# Patient Record
Sex: Male | Born: 1970 | Hispanic: No | Marital: Single | Smoking: Never smoker
Health system: Southern US, Community
[De-identification: ages and names within clinical notes are randomized; demographics above are authoritative.]

## PROBLEM LIST (undated history)

## (undated) DIAGNOSIS — M064 Inflammatory polyarthropathy: Secondary | ICD-10-CM

## (undated) DIAGNOSIS — M109 Gout, unspecified: Secondary | ICD-10-CM

## (undated) HISTORY — DX: Inflammatory polyarthropathy: M06.4

## (undated) HISTORY — DX: Gout, unspecified: M10.9

---

## 2007-05-05 ENCOUNTER — Ambulatory Visit: Payer: Self-pay | Admitting: Family Medicine

## 2007-05-06 ENCOUNTER — Ambulatory Visit: Payer: Self-pay | Admitting: *Deleted

## 2007-06-23 ENCOUNTER — Ambulatory Visit: Payer: Self-pay | Admitting: Family Medicine

## 2007-06-23 DIAGNOSIS — M109 Gout, unspecified: Secondary | ICD-10-CM

## 2007-08-04 ENCOUNTER — Ambulatory Visit: Payer: Self-pay | Admitting: Internal Medicine

## 2007-08-05 ENCOUNTER — Encounter (INDEPENDENT_AMBULATORY_CARE_PROVIDER_SITE_OTHER): Payer: Self-pay | Admitting: Internal Medicine

## 2007-08-05 LAB — CONVERTED CEMR LAB: Uric Acid, Serum: 10.1 mg/dL — ABNORMAL HIGH (ref 2.4–7.0)

## 2008-01-05 ENCOUNTER — Ambulatory Visit: Payer: Self-pay | Admitting: Internal Medicine

## 2008-01-05 LAB — CONVERTED CEMR LAB: VLDL: 34 mg/dL (ref 0–40)

## 2008-04-04 ENCOUNTER — Emergency Department (HOSPITAL_COMMUNITY): Admission: EM | Admit: 2008-04-04 | Discharge: 2008-04-04 | Payer: Self-pay | Admitting: Emergency Medicine

## 2009-02-08 ENCOUNTER — Ambulatory Visit: Payer: Self-pay | Admitting: Internal Medicine

## 2009-03-22 ENCOUNTER — Encounter (INDEPENDENT_AMBULATORY_CARE_PROVIDER_SITE_OTHER): Payer: Self-pay | Admitting: Adult Health

## 2009-03-22 ENCOUNTER — Ambulatory Visit: Payer: Self-pay | Admitting: Internal Medicine

## 2009-03-22 LAB — CONVERTED CEMR LAB
ALT: 15 units/L (ref 0–53)
AST: 19 units/L (ref 0–37)
Albumin: 4.7 g/dL (ref 3.5–5.2)
Alkaline Phosphatase: 100 units/L (ref 39–117)
Creatinine, Ser: 1.23 mg/dL (ref 0.40–1.50)
HDL: 38 mg/dL — ABNORMAL LOW (ref >39)
LDL Cholesterol: 111 mg/dL — ABNORMAL HIGH (ref 0–99)
Potassium: 3.8 meq/L (ref 3.5–5.3)
Total Protein: 7.7 g/dL (ref 6.0–8.3)
Uric Acid, Serum: 10.6 mg/dL — ABNORMAL HIGH (ref 4.0–7.8)

## 2009-04-07 ENCOUNTER — Ambulatory Visit: Payer: Self-pay | Admitting: Adult Health

## 2009-04-07 LAB — CONVERTED CEMR LAB
Basophils Absolute: 0.1 10*3/uL (ref 0.0–0.1)
Basophils Relative: 1 % (ref 0–1)
Eosinophils Absolute: 0.1 10*3/uL (ref 0.0–0.7)
Eosinophils Relative: 1 % (ref 0–5)
Hemoglobin: 14.3 g/dL (ref 13.0–17.0)
Lymphocytes Relative: 34 % (ref 12–46)
MCV: 91.1 fL (ref 78.0–100.0)
Neutro Abs: 2.9 10*3/uL (ref 1.7–7.7)
Neutrophils Relative %: 57 % (ref 43–77)
RBC: 4.7 M/uL (ref 4.22–5.81)

## 2010-02-19 ENCOUNTER — Ambulatory Visit: Payer: Self-pay | Admitting: Internal Medicine

## 2010-03-05 ENCOUNTER — Ambulatory Visit: Payer: Self-pay | Admitting: Internal Medicine

## 2010-04-03 ENCOUNTER — Ambulatory Visit: Payer: Self-pay | Admitting: Internal Medicine

## 2010-04-03 ENCOUNTER — Encounter (INDEPENDENT_AMBULATORY_CARE_PROVIDER_SITE_OTHER): Payer: Self-pay | Admitting: Adult Health

## 2010-04-03 LAB — CONVERTED CEMR LAB
CO2: 26 meq/L (ref 19–32)
Chloride: 102 meq/L (ref 96–112)
Cholesterol: 153 mg/dL (ref 0–200)
Creatinine, Ser: 1.22 mg/dL (ref 0.40–1.50)
LDL Cholesterol: 84 mg/dL (ref 0–99)
Potassium: 4.4 meq/L (ref 3.5–5.3)
Sodium: 139 meq/L (ref 135–145)
Total CHOL/HDL Ratio: 3.3
Total Protein: 7.6 g/dL (ref 6.0–8.3)

## 2010-05-16 ENCOUNTER — Encounter (INDEPENDENT_AMBULATORY_CARE_PROVIDER_SITE_OTHER): Payer: Self-pay | Admitting: Adult Health

## 2010-05-16 ENCOUNTER — Ambulatory Visit: Payer: Self-pay | Admitting: Internal Medicine

## 2010-05-16 LAB — CONVERTED CEMR LAB
Anti Nuclear Antibody(ANA): NEGATIVE
Basophils Absolute: 0.1 10*3/uL (ref 0.0–0.1)
CRP: 0.3 mg/dL (ref <0.6)
Eosinophils Absolute: 0.1 10*3/uL (ref 0.0–0.7)
Monocytes Absolute: 0.3 10*3/uL (ref 0.1–1.0)
Monocytes Relative: 6 % (ref 3–12)
Platelets: 282 10*3/uL (ref 150–400)
RBC: 5.28 M/uL (ref 4.22–5.81)
RDW: 13 % (ref 11.5–15.5)
Rhuematoid fact SerPl-aCnc: <20 intl units/mL (ref 0–20)
Sed Rate: 21 mm/hr — ABNORMAL HIGH (ref 0–16)
WBC: 4.9 10*3/uL (ref 4.0–10.5)

## 2010-05-17 ENCOUNTER — Ambulatory Visit (HOSPITAL_COMMUNITY): Admission: RE | Admit: 2010-05-17 | Discharge: 2010-05-17 | Payer: Self-pay | Admitting: Internal Medicine

## 2010-05-30 ENCOUNTER — Ambulatory Visit: Payer: Self-pay | Admitting: Internal Medicine

## 2010-11-10 ENCOUNTER — Encounter (INDEPENDENT_AMBULATORY_CARE_PROVIDER_SITE_OTHER): Payer: Self-pay | Admitting: *Deleted

## 2011-12-12 DIAGNOSIS — M064 Inflammatory polyarthropathy: Secondary | ICD-10-CM | POA: Insufficient documentation

## 2011-12-14 IMAGING — CR DG HAND COMPLETE 3+V*L*
3 series · 3 of 3 positions shown · non-contrast
Comparison: None.

CLINICAL DATA: 39-year-old male with bilateral hand pain.  No known
injury.  Query gout, rheumatoid arthritis.

LEFT HAND - COMPLETE 3+ VIEW,
RIGHT HAND - COMPLETE 3+ VIEW

[x hand pa left]
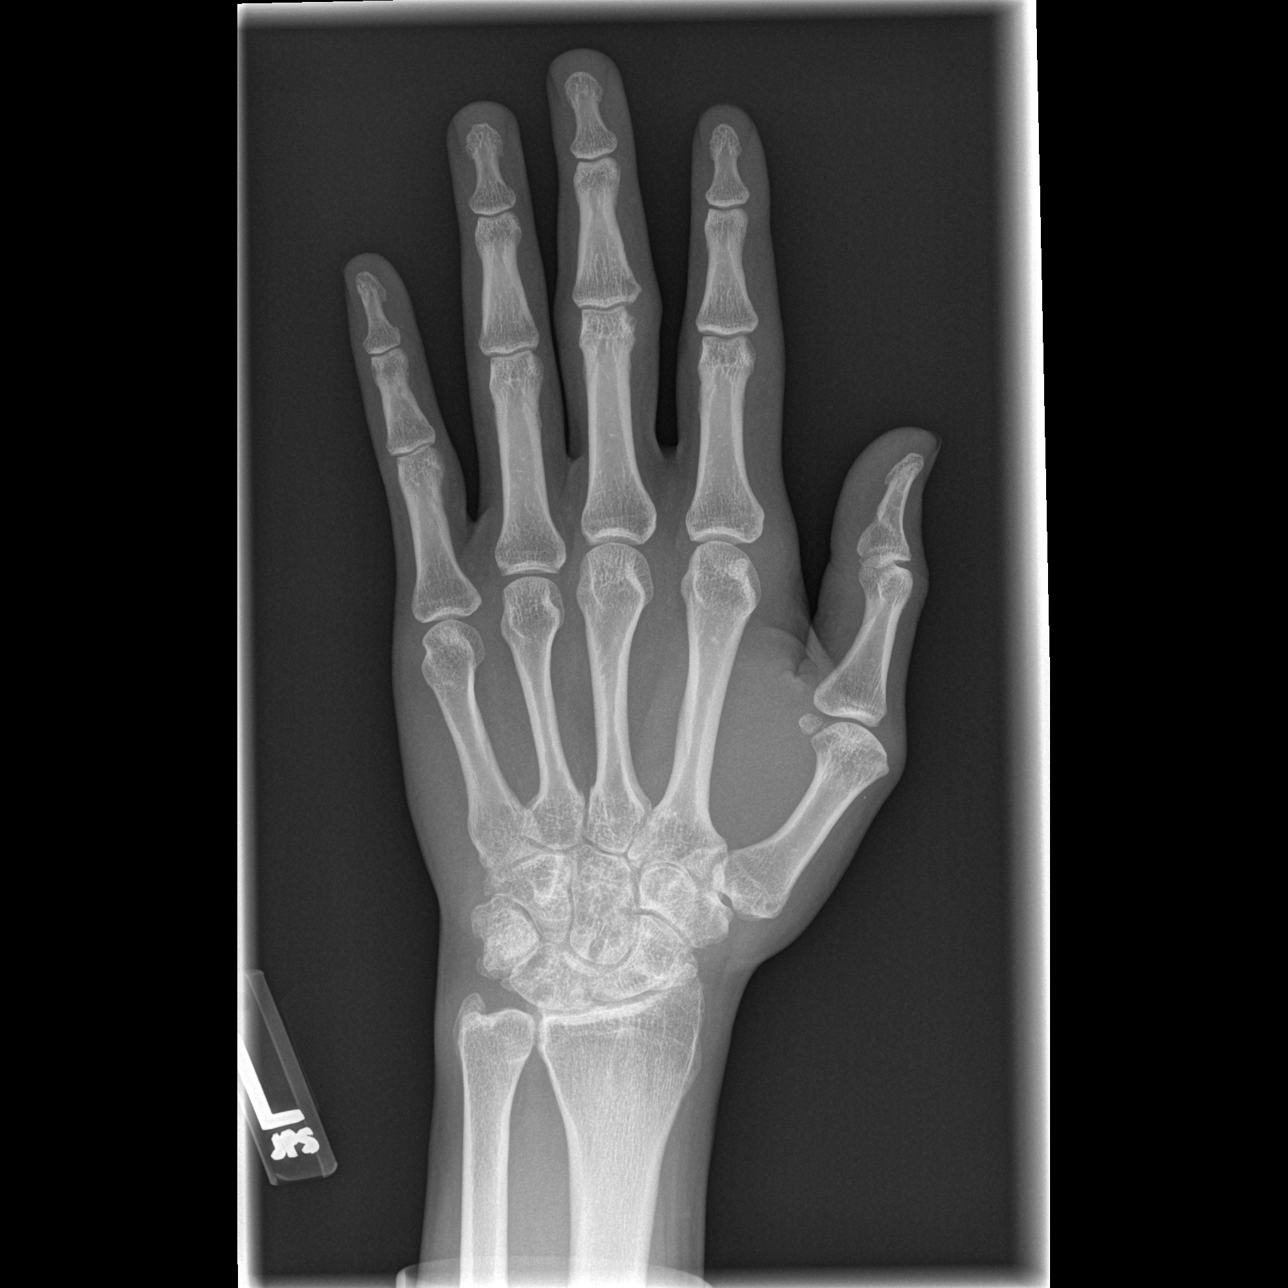

[x hand oblique left]
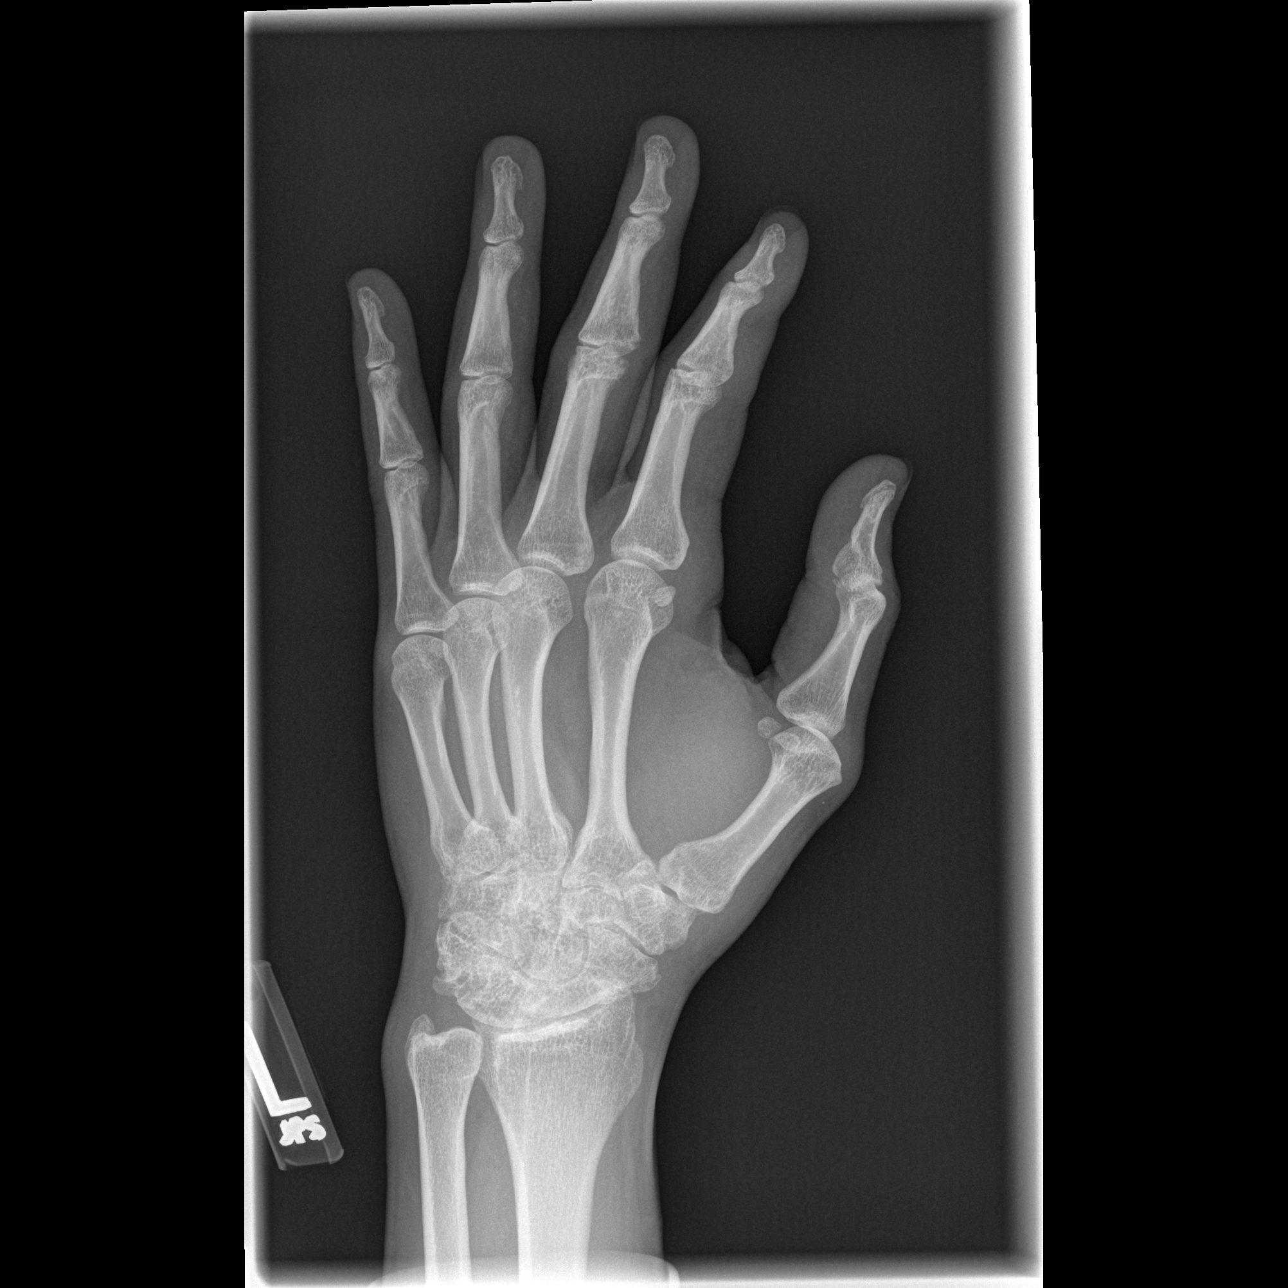

[x hand lat left]
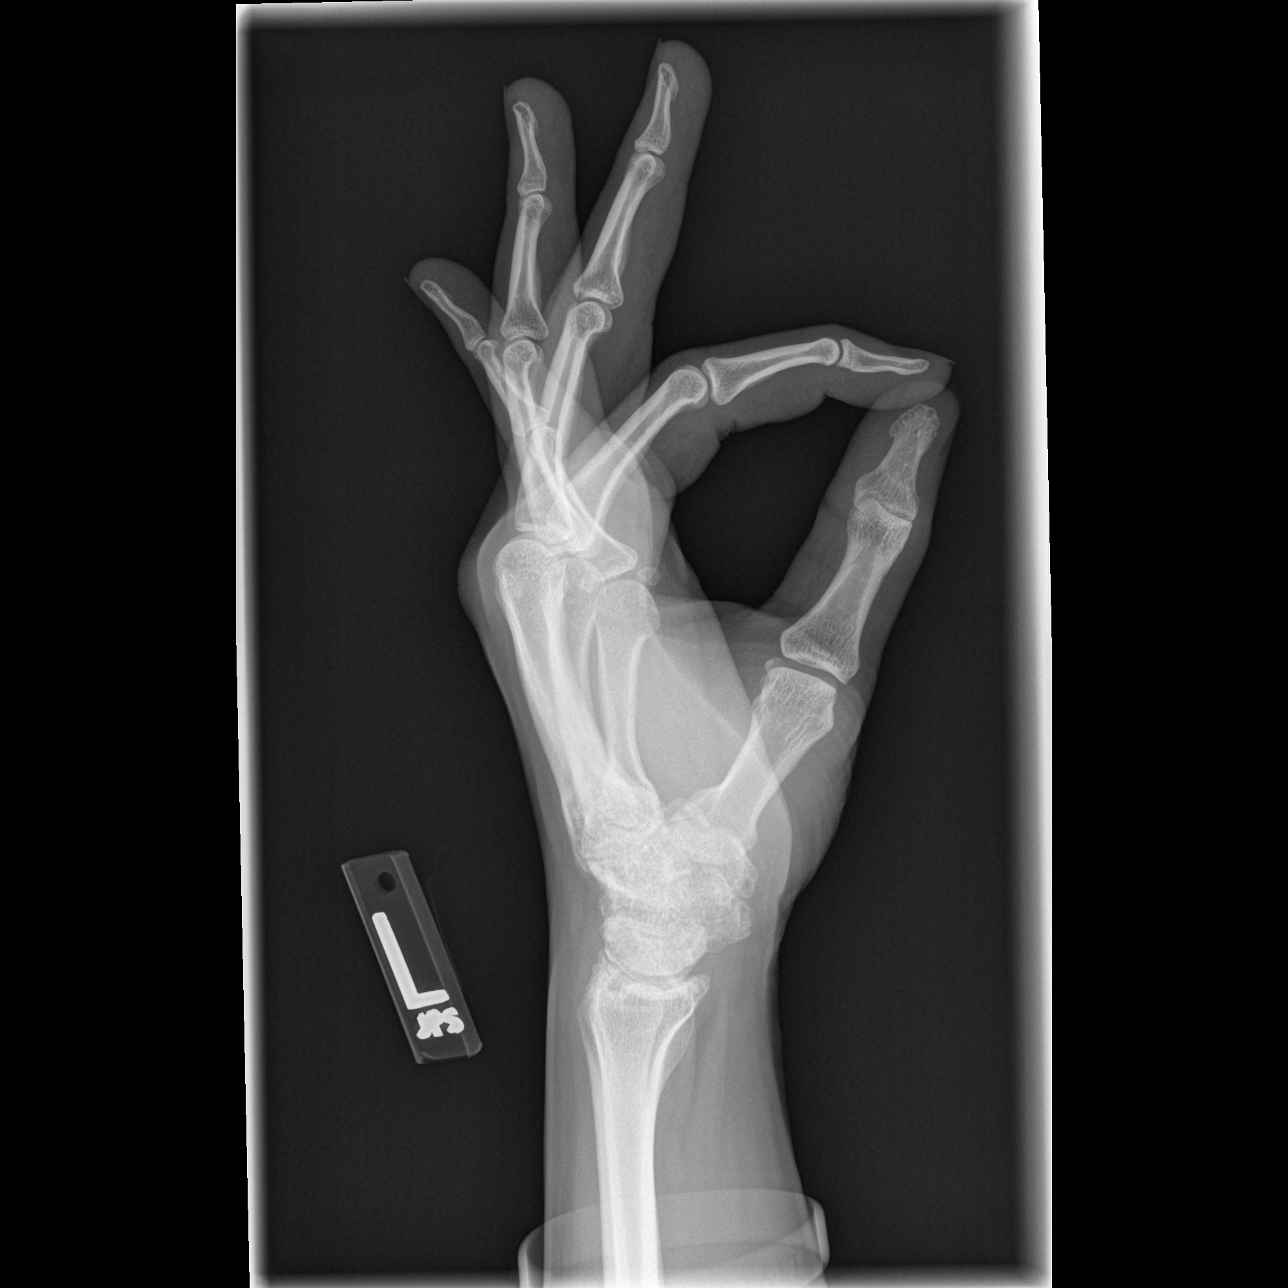

[3 of 3 positions shown; findings below may reference images not displayed]

FINDINGS: Left hand: Bone mineralization is within normal limits. There is
carpal joint space narrowing with subchondral sclerosis.  No
periarticular erosions are identified but there is subchondral
lucency.  There is subtle irregularity at the radial aspect of the
left third proximal interphalangeal joint, with some subarticular
lucency similar the carpal bones.  Other joint spaces appear
normal.  No acute fracture or dislocation.

Right hand: Bone mineralization is within normal limits.
Periarticular/subchondral lucency and irregularity at the second
and third metatarsal phalangeal joint, joint spaces appear
relatively preserved.  No periosteal new bone.  Suggestion of
carpal joint space loss, but not as pronounced as on the opposite
side.  Outside these areas, joint spaces are within normal limits.
No acute fracture.
IMPRESSION: Abnormality involving the carpal bones left greater than right, the
right second and third MCP joints, and the left third PIP joint.
Constellation of findings favors pseudo gout (calcium pyrophosphate
deposition disease.). Clinical correlation recommended.

## 2019-01-22 ENCOUNTER — Encounter: Payer: Self-pay | Admitting: Nurse Practitioner

## 2019-01-22 ENCOUNTER — Ambulatory Visit: Payer: Self-pay | Attending: Nurse Practitioner | Admitting: Nurse Practitioner

## 2019-01-22 VITALS — BP 121/88 | HR 106 | Ht 66.0 in | Wt 169.6 lb

## 2019-01-22 DIAGNOSIS — M1A49X0 Other secondary chronic gout, multiple sites, without tophus (tophi): Secondary | ICD-10-CM

## 2019-01-22 DIAGNOSIS — M064 Inflammatory polyarthropathy: Secondary | ICD-10-CM

## 2019-01-22 MED ORDER — CYCLOBENZAPRINE HCL 5 MG PO TABS: 5.0000 mg | ORAL_TABLET | Freq: Three times a day (TID) | ORAL | 1 refills | Status: AC | PRN

## 2019-01-22 MED ORDER — ALLOPURINOL 100 MG PO TABS: 100.0000 mg | ORAL_TABLET | Freq: Every day | ORAL | 6 refills | Status: DC

## 2019-01-22 MED ORDER — PREDNISONE 20 MG PO TABS: ORAL_TABLET | ORAL | 0 refills | Status: DC

## 2019-01-22 MED FILL — ALLOPURINOL 100 MG TABLET: 100 | 30 days supply | Qty: 30 | Fill #0

## 2019-01-22 MED FILL — CYCLOBENZAPRINE 5 MG TABLET: 5 | 20 days supply | Qty: 60 | Fill #0

## 2019-01-22 MED FILL — predniSONE 20 MG TABS: 20 | 6 days supply | Qty: 7 | Fill #0

## 2019-01-22 NOTE — Progress Notes (Signed)
Assessment & Plan:  Terre was seen today for back pain and leg pain.  Diagnoses and all orders for this visit:  Other secondary chronic gout of multiple sites without tophus -     Uric Acid -     allopurinol (ZYLOPRIM) 100 MG tablet; Take 1 tablet (100 mg total) by mouth daily.  Inflammatory polyarthropathy (HCC) -     CBC -     CMP14+EGFR -     cyclobenzaprine (FLEXERIL) 5 MG tablet; Take 1 tablet (5 mg total) by mouth 3 (three) times daily as needed for up to 30 days for muscle spasms. -     predniSONE (DELTASONE) 20 MG tablet; Take 2 tablets by mouth once a day on days 1-2; take 1 tablet on days 3-4; take 1/2 tablet on days 5-6 then stop   Patient has been advised to apply for financial assistance and schedule to see our financial counselor.     Patient has been counseled on age-appropriate routine health concerns for screening and prevention. These are reviewed and up-to-date. Referrals have been placed accordingly. Immunizations are up-to-date or declined.    Subjective:   Chief Complaint  Patient presents with  . Back Pain  . Leg Pain   HPI Nathan Rubio 48 y.o. male presents to office today to establish care. He has a history of gout with inflammatory polyarthropathy. He has not seen a rheumatologist since 2013. He has been taking a colchicine from [place] as needed. He will need to be referred to a rheumatologist due to his advance disease. He has been on as much as 700 mg of allopurinol per review of office notes from 07-23-2012. Previous medication regimen:  Allopurinol 540m daily, colchicine once daily and chronic prednisone 560mdaily.   Arthritis: Symptoms have been present for several years. Onset was gradual. Symptoms include joint pain, joint swelling, morning stiffness, skin nodules and sleep difficulties and are of moderate and severe severity. Patient denies eye symptoms and weakness of hands. Symptoms are made worse by: nothing.  Symptoms are helped by  arthritis medications.  Associated symptoms include none. Patient denies associated nausea and oral ulcers.  Overall disease activity:  worse. Limitation on activities include difficulty with walking, difficulty with getting dressed and difficulty with ADLs.    Back Pain Chronic. He endorses lower left sided lumbar pain radiating to the left side of abdomen. He has been seeing a chiropractor over the past few weeks for adjustments of his spine which he reports has provided some relief of his pain. Pain is described as sharp, aching, and tight. Aggravating factors: sitting, standing, twisting, turning, lying on his left side. Relieving factors: NONE.    Review of Systems  Constitutional: Negative for fever, malaise/fatigue and weight loss.  HENT: Negative.  Negative for nosebleeds.   Eyes: Negative.  Negative for blurred vision, double vision and photophobia.  Respiratory: Negative.  Negative for cough and shortness of breath.   Cardiovascular: Negative.  Negative for chest pain, palpitations and leg swelling.  Gastrointestinal: Negative.  Negative for heartburn, nausea and vomiting.  Musculoskeletal: Positive for back pain and joint pain. Negative for myalgias.       SEE HPI  Neurological: Negative.  Negative for dizziness, focal weakness, seizures and headaches.  Psychiatric/Behavioral: Negative.  Negative for suicidal ideas.    Past Medical History:  Diagnosis Date  . Gout   . Inflammatory polyarthritis ([facility]    History reviewed. No pertinent surgical history.  Family History  Problem  Relation Age of Onset  . Depression Neg Hx   . Diabetes Neg Hx     Social History Reviewed with no changes to be made today.   No outpatient medications prior to visit.   No facility-administered medications prior to visit.     No Known Allergies     Objective:    BP 121/88   Pulse (!) 106   Ht _0  (1.676 m)   Wt 169 lb 9.6 oz (76.9 kg)   SpO2 99%   BMI 27.37 kg/m  Wt Readings  from Last 3 Encounters:  01/22/19 169 lb 9.6 oz (76.9 kg)    Physical Exam Vitals signs and nursing note reviewed.  Constitutional:      Appearance: He is well-developed.  HENT:     Head: Normocephalic and atraumatic.  Neck:     Musculoskeletal: Normal range of motion.  Cardiovascular:     Rate and Rhythm: Normal rate and regular rhythm.     Heart sounds: Normal heart sounds. No murmur. No friction rub. No gallop.   Pulmonary:     Effort: Pulmonary effort is normal. No tachypnea or respiratory distress.     Breath sounds: Normal breath sounds. No decreased breath sounds, wheezing, rhonchi or rales.  Chest:     Chest wall: No tenderness.  Abdominal:     General: Bowel sounds are normal.     Palpations: Abdomen is soft.  Musculoskeletal:     Lumbar back: He exhibits tenderness and pain. He exhibits no bony tenderness, no swelling and no edema.       Arms:     Comments: Significant nodular deformities of bilateral DIPs  Skin:    General: Skin is warm and dry.  Neurological:     Mental Status: He is alert and oriented to person, place, and time.     Coordination: Coordination normal.  Psychiatric:        Behavior: Behavior normal. Behavior is cooperative.        Thought Content: Thought content normal.        Judgment: Judgment normal.          Patient has been counseled extensively about nutrition and exercise as well as the importance of adherence with medications and regular follow-up. The patient was given clear instructions to go to ER or return to medical center if symptoms don't improve, worsen or new problems develop. The patient verbalized understanding.   Follow-up: Return in about 4 weeks (around 02/19/2019) for polyarthralgia; , Needs appointment with financial representative.Gildardo Pounds, FNP-BC [facility] [place], [place]   01/23/2019, 12:04 AM

## 2019-01-22 NOTE — Patient Instructions (Signed)
Joint Pain Joint pain (arthralgia) may be caused by many things. Joint pain is likely to go away when you follow instructions from your health care provider for relieving pain at home. However, joint pain can also be caused by conditions that require more treatment. Common causes of joint pain include:  Bruising in the area of the joint.  Injury caused by repeating certain movements too many times (overuse injury).  Age-related joint wear and tear (osteoarthritis).  Buildup of uric acid crystals in the joint (gout).  Inflammation of the joint (rheumatic disease).  Various other forms of arthritis.  Infections of the joint (septic arthritis) or of the bone (osteomyelitis). Your health care provider may recommend that you take pain medicine or wear a supportive device like an elastic bandage, sling, or splint. If your joint pain continues, you may need lab or imaging tests to diagnose the cause of your joint pain. Follow these instructions at home: Managing pain, stiffness, and swelling   If directed, put ice on the painful area. Icing can help to relieve joint pain and swelling. ? Put ice in a plastic bag. ? Place a towel between your skin and the bag. ? Leave the ice on for 20 minutes, 2-3 times a day.  If directed, apply heat to the painful area as often as told by your health care provider. Heat can reduce the stiffness of your muscles and joints. Use the heat source that your health care provider recommends, such as a moist heat pack or a heating pad. ? Place a towel between your skin and the heat source. ? Leave the heat on for 20-30 minutes. ? Remove the heat if your skin turns bright red. This is especially important if you are unable to feel pain, heat, or cold. You may have a greater risk of getting burned.  Move your fingers or toes below the painful joint often. You can avoid stiffness and lessen swelling by doing this.  If possible, raise (elevate) the painful joint above  the level of your heart while you are sitting or lying down. To do this, try putting a few pillows under the painful joint. Activity  Rest the painful joint for as long as directed. Do not do anything that causes or worsens pain.  Begin exercising or stretching the affected area, as told by your health care provider. Ask your health care provider what types of exercise are safe for you. If you have an elastic bandage, sling, or splint:  Wear the supportive device as told by your health care provider. Remove it only as told by your health care provider.  Loosen the device if your fingers or toes below the joint tingle, become numb, or turn cold and blue.  Keep the device clean.  Ask your health care provider if you should remove the device before bathing. You may need to cover it with a watertight covering when you take a bath or a shower. General instructions  Take over-the-counter and prescription medicines only as told by your health care provider.  Do not use any products that contain nicotine or tobacco, such as cigarettes and e-cigarettes. If you need help quitting, ask your health care provider.  Keep all follow-up visits as told by your health care provider. This is important. Contact a health care provider if:  You have pain that gets worse and does not get better with medicine.  Your joint pain does not improve within 3 days.  You have increased bruising or swelling.  You have a fever.  You lose 10 lb (4.5 kg) or more without trying. Get help right away if:  You cannot move the joint.  Your fingers or toes tingle, become numb, or turn cold and blue.  You have a fever along with a joint that is red, warm, and swollen. Summary  Joint pain (arthralgia) may be caused by many things.  Your health care provider may recommend that you take pain medicine or wear a supportive device like an elastic bandage, sling, or splint.  If your joint pain continues, you may need  tests to diagnose the cause of your joint pain.  Take over-the-counter and prescription medicines only as told by your health care provider. This information is not intended to replace advice given to you by your health care provider. Make sure you discuss any questions you have with your health care provider. Document Released: 12/09/2005 Document Revised: 09/24/2017 Document Reviewed: 09/24/2017 Elsevier Interactive Patient Education  2019 Else[employer] Inc.  Gout  Gout is painful swelling of your joints. Gout is a type of arthritis. It is caused by having too much uric acid in your body. Uric acid is a chemical that is made when your body breaks down substances called purines. If your body has too much uric acid, sharp crystals can form and build up in your joints. This causes pain and swelling. Gout attacks can happen quickly and be very painful (acute gout). Over time, the attacks can affect more joints and happen more often (chronic gout). What are the causes?  Too much uric acid in your blood. This can happen because: ? Your kidneys do not remove enough uric acid from your blood. ? Your body makes too much uric acid. ? You eat too many foods that are high in purines. These foods include organ meats, some seafood, and beer.  Trauma or stress. What increases the risk?  Having a family history of gout.  Being male and middle-aged.  Being male and having gone through menopause.  Being very overweight (obese).  Drinking alcohol, especially beer.  Not having enough water in the body (being dehydrated).  Losing weight too quickly.  Having an organ transplant.  Having lead poisoning.  Taking certain medicines.  Having kidney disease.  Having a skin condition called psoriasis. What are the signs or symptoms? An attack of acute gout usually happens in just one joint. The most common place is the big toe. Attacks often start at night. Other joints that may be affected include  joints of the feet, ankle, knee, fingers, wrist, or elbow. Symptoms of an attack may include:  Very bad pain.  Warmth.  Swelling.  Stiffness.  Shiny, red, or purple skin.  Tenderness. The affected joint may be very painful to touch.  Chills and fever. Chronic gout may cause symptoms more often. More joints may be involved. You may also have white or yellow lumps (tophi) on your hands or feet or in other areas near your joints. How is this treated?  Treatment for this condition has two phases: treating an acute attack and preventing future attacks.  Acute gout treatment may include: ? NSAIDs. ? Steroids. These are taken by mouth or injected into a joint. ? Colchicine. This medicine relieves pain and swelling. It can be given by mouth or through an IV tube.  Preventive treatment may include: ? Taking small doses of NSAIDs or colchicine daily. ? Using a medicine that reduces uric acid levels in your blood. ? Making changes to  your diet. You may need to see a food expert (dietitian) about what to eat and drink to prevent gout. Follow these instructions at home: During a gout attack   If told, put ice on the painful area: ? Put ice in a plastic bag. ? Place a towel between your skin and the bag. ? Leave the ice on for 20 minutes, 2-3 times a day.  Raise (elevate) the painful joint above the level of your heart as often as you can.  Rest the joint as much as possible. If the joint is in your leg, you may be given crutches.  Follow instructions from your doctor about what you cannot eat or drink. Avoiding future gout attacks  Eat a low-purine diet. Avoid foods and drinks such as: ? Liver. ? Kidney. ? Anchovies. ? Asparagus. ? Herring. ? Mushrooms. ? Mussels. ? Beer.  Stay at a healthy weight. If you want to lose weight, talk with your doctor. Do not lose weight too fast.  Start or continue an exercise plan as told by your doctor. Eating and drinking  Drink enough  fluids to keep your pee (urine) pale yellow.  If you drink alcohol: ? Limit how much you use to:  0-1 drink a day for women.  0-2 drinks a day for men. ? Be aware of how much alcohol is in your drink. In the [place] one drink equals one 12 oz bottle of beer (355 mL), one 5 oz glass of wine (148 mL), or one 1 oz glass of hard liquor (44 mL). General instructions  Take over-the-counter and prescription medicines only as told by your doctor.  Do not drive or use heavy machinery while taking prescription pain medicine.  Return to your normal activities as told by your doctor. Ask your doctor what activities are safe for you.  Keep all follow-up visits as told by your doctor. This is important. Contact a doctor if:  You have another gout attack.  You still have symptoms of a gout attack after 10 days of treatment.  You have problems (side effects) because of your medicines.  You have chills or a fever.  You have burning pain when you pee (urinate).  You have pain in your lower back or belly. Get help right away if:  You have very bad pain.  Your pain cannot be controlled.  You cannot pee. Summary  Gout is painful swelling of the joints.  The most common site of pain is the big toe, but it can affect other joints.  Medicines and avoiding some foods can help to prevent and treat gout attacks. This information is not intended to replace advice given to you by your health care provider. Make sure you discuss any questions you have with your health care provider. Document Released: 09/17/2008 Document Revised: 07/01/2018 Document Reviewed: 07/01/2018 Elsevier Interactive Patient Education  2019 Elsevier Inc.  Low-Purine Eating Plan A low-purine eating plan involves making food choices to limit your intake of purine. Purine is a kind of uric acid. Too much uric acid in your blood can cause certain conditions, such as gout and kidney stones. Eating a low-purine diet can help  control these conditions. What are tips for following this plan? Reading food labels   Avoid foods with saturated or Trans fat.  Check the ingredient list of grains-based foods, such as bread and cereal, to make sure that they contain whole grains.  Check the ingredient list of sauces or soups to make sure they do  not contain meat or fish.  When choosing soft drinks, check the ingredient list to make sure they do not contain high-fructose corn syrup. Shopping  Buy plenty of fresh fruits and vegetables.  Avoid buying canned or fresh fish.  Buy dairy products labeled as low-fat or nonfat.  Avoid buying premade or processed foods. These foods are often high in fat, salt (sodium), and added sugar. Cooking  Use olive oil instead of butter when cooking. Oils like olive oil, canola oil, and sunflower oil contain healthy fats. Meal planning  Learn which foods do or do not affect you. If you find out that a food tends to cause your gout symptoms to flare up, avoid eating that food. You can enjoy foods that do not cause problems. If you have any questions about a food item, talk with your dietitian or health care provider.  Limit foods high in fat, especially saturated fat. Fat makes it harder for your body to get rid of uric acid.  Choose foods that are lower in fat and are lean sources of protein. General guidelines  Limit alcohol intake to no more than 1 drink a day for nonpregnant women and 2 drinks a day for men. One drink equals 12 oz of beer, 5 oz of wine, or 1 oz of hard liquor. Alcohol can affect the way your body gets rid of uric acid.  Drink plenty of water to keep your urine clear or pale yellow. Fluids can help remove uric acid from your body.  If directed by your health care provider, take a vitamin C supplement.  Work with your health care provider and dietitian to develop a plan to achieve or maintain a healthy weight. Losing weight can help reduce uric acid in your  blood. What foods are recommended? The items listed may not be a complete list. Talk with your dietitian about what dietary choices are best for you. Foods low in purines Foods low in purines do not need to be limited. These include:  All fruits.  All low-purine vegetables, pickles, and olives.  Breads, pasta, rice, cornbread, and popcorn. Cake and other baked goods.  All dairy foods.  Eggs, nuts, and nut butters.  Spices and condiments, such as salt, herbs, and vinegar.  Plant oils, butter, and margarine.  Water, sugar-free soft drinks, tea, coffee, and cocoa.  Vegetable-based soups, broths, sauces, and gravies. Foods moderate in purines Foods moderate in purines should be limited to the amounts listed.   cup of asparagus, cauliflower, spinach, mushrooms, or green peas, each day.  2/3 cup uncooked oatmeal, each day.   cup dry wheat bran or wheat germ, each day.  2-3 ounces of meat or poultry, each day.  4-6 ounces of shellfish, such as crab, lobster, oysters, or shrimp, each day.  1 cup cooked beans, peas, or lentils, each day.  Soup, broths, or bouillon made from meat or fish. Limit these foods as much as possible. What foods are not recommended? The items listed may not be a complete list. Talk with your dietitian about what dietary choices are best for you. Limit your intake of foods high in purines, including:  Beer and other alcohol.  Meat-based gravy or sauce.  Canned or fresh fish, such as: ? Anchovies, sardines, herring, and tuna. ? Mussels and scallops. ? Codfish, trout, and haddock.  Tomasa BlaseBacon.  Organ meats, such as: ? Liver or kidney. ? Tripe. ? Sweetbreads (thymus gland or pancreas).  Wild Education officer, environmentalgame or goose.  Yeast or yeast extract  supplements.  Drinks sweetened with high-fructose corn syrup. Summary  Eating a low-purine diet can help control conditions caused by too much uric acid in the body, such as gout or kidney stones.  Choose  low-purine foods, limit alcohol, and limit foods high in fat.  You will learn over time which foods do or do not affect you. If you find out that a food tends to cause your gout symptoms to flare up, avoid eating that food. This information is not intended to replace advice given to you by your health care provider. Make sure you discuss any questions you have with your health care provider. Document Released: 04/05/2011 Document Revised: 01/22/2017 Document Reviewed: 01/22/2017 Elsevier Interactive Patient Education  2019 [employer].

## 2019-01-23 ENCOUNTER — Encounter: Payer: Self-pay | Admitting: Nurse Practitioner

## 2019-01-23 LAB — CBC
HEMATOCRIT: 44.6 % (ref 37.5–51.0)
HEMOGLOBIN: 14.7 g/dL (ref 13.0–17.7)
MCH: 28.2 pg (ref 26.6–33.0)
MCHC: 33 g/dL (ref 31.5–35.7)
MCV: 86 fL (ref 79–97)
Platelets: 395 10*3/uL (ref 150–450)
RBC: 5.21 x10E6/uL (ref 4.14–5.80)
RDW: 13 % (ref 11.6–15.4)
WBC: 9.8 10*3/uL (ref 3.4–10.8)

## 2019-01-23 LAB — CMP14+EGFR
A/G RATIO: 1.4 (ref 1.2–2.2)
ALT: 13 IU/L (ref 0–44)
AST: 19 IU/L (ref 0–40)
Albumin: 4.6 g/dL (ref 4.0–5.0)
Alkaline Phosphatase: 83 IU/L (ref 39–117)
BILIRUBIN TOTAL: 0.3 mg/dL (ref 0.0–1.2)
BUN/Creatinine Ratio: 11 (ref 9–20)
BUN: 18 mg/dL (ref 6–24)
CO2: 24 mmol/L (ref 20–29)
Calcium: 9.8 mg/dL (ref 8.7–10.2)
Chloride: 97 mmol/L (ref 96–106)
Creatinine, Ser: 1.66 mg/dL — ABNORMAL HIGH (ref 0.76–1.27)
GFR, EST AFRICAN AMERICAN: 56 mL/min/{1.73_m2} — AB (ref >59)
GFR, EST NON AFRICAN AMERICAN: 48 mL/min/{1.73_m2} — AB (ref >59)
GLOBULIN, TOTAL: 3.4 g/dL (ref 1.5–4.5)
Glucose: 107 mg/dL — ABNORMAL HIGH (ref 65–99)
POTASSIUM: 4.1 mmol/L (ref 3.5–5.2)
SODIUM: 140 mmol/L (ref 134–144)
TOTAL PROTEIN: 8 g/dL (ref 6.0–8.5)

## 2019-01-23 LAB — URIC ACID: URIC ACID: 10.1 mg/dL — AB (ref 3.7–8.6)

## 2019-01-25 ENCOUNTER — Other Ambulatory Visit: Payer: Self-pay | Admitting: Nurse Practitioner

## 2019-01-25 MED ORDER — COLCHICINE 0.6 MG PO TABS: 0.6000 mg | ORAL_TABLET | Freq: Two times a day (BID) | ORAL | 3 refills | Status: DC

## 2019-01-26 ENCOUNTER — Telehealth: Payer: Self-pay

## 2019-01-26 MED FILL — !COLCRYS 0.6 MG TABLET: 0.6 MG | 15 days supply | Qty: 30 | Fill #0

## 2019-01-26 NOTE — Telephone Encounter (Signed)
Patient was inform on lab results and Rx.  Patient verified DOB and understood.

## 2019-01-26 NOTE — Telephone Encounter (Signed)
-----   Message from Claiborne Rigg, NP sent at 01/25/2019 10:01 PM EST ----- Uric acid level is 10.1. Will need to recheck at your next office visit. Will send in colchicine for you to take once your prednisone is complete.

## 2019-01-26 NOTE — Telephone Encounter (Signed)
CMA attempt to call patient to inform on lab results. Both home and cell number have no voicemail that has been set up.   A letter will be send out to reach patient.

## 2019-02-17 MED FILL — ALLOPURINOL 100 MG TABLET: 100 | 30 days supply | Qty: 30 | Fill #1

## 2019-02-17 MED FILL — !COLCRYS 0.6 MG TABLET: 0.6 MG | 15 days supply | Qty: 30 | Fill #1

## 2019-02-24 ENCOUNTER — Ambulatory Visit: Payer: Self-pay | Attending: Nurse Practitioner | Admitting: Nurse Practitioner

## 2019-02-24 ENCOUNTER — Encounter: Payer: Self-pay | Admitting: Nurse Practitioner

## 2019-02-24 ENCOUNTER — Ambulatory Visit: Payer: Self-pay | Admitting: Nurse Practitioner

## 2019-02-24 VITALS — BP 143/103 | HR 80 | Temp 98.0°F | Ht 66.0 in | Wt 169.2 lb

## 2019-02-24 DIAGNOSIS — M1A09X0 Idiopathic chronic gout, multiple sites, without tophus (tophi): Secondary | ICD-10-CM

## 2019-02-24 DIAGNOSIS — R6882 Decreased libido: Secondary | ICD-10-CM

## 2019-02-24 DIAGNOSIS — R030 Elevated blood-pressure reading, without diagnosis of hypertension: Secondary | ICD-10-CM

## 2019-02-24 NOTE — Patient Instructions (Addendum)
Inc.  Gout  Gout is a condition that causes painful swelling of the joints. Gout is a type of inflammation of the joints (arthritis). This condition is caused by having too much uric acid in the body. Uric acid is a chemical that forms when the body breaks down substances called purines. Purines are important for building body proteins. When the body has too much uric acid, sharp crystals can form and build up inside the joints. This causes pain and swelling. Gout attacks can happen quickly and may be very painful (acute gout). Over time, the attacks can affect more joints and become more frequent (chronic gout). Gout can also cause uric acid to build up under the skin and inside the kidneys. What are the causes? This condition is caused by too much uric acid in your blood. This can happen because:  Your kidneys do not remove enough uric acid from your blood. This is the most common cause.  Your body makes too much uric acid. This can happen with some cancers and cancer treatments. It can also occur if your body is breaking down too many red blood cells (hemolytic anemia).  You eat too many foods that are high in purines. These foods include organ meats and some seafood. Alcohol, especially beer, is also high in purines. A gout attack may be triggered by trauma or stress. What increases the risk? You are more likely to develop this condition if you:  Have a family history of gout.  Are male and middle-aged.  Are male and have gone through menopause.  Are obese.  Frequently drink alcohol, especially beer.  Are dehydrated.  Lose weight too quickly.  Have an organ transplant.  Have lead poisoning.  Take certain medicines, including aspirin, cyclosporine, diuretics, levodopa, and niacin.  Have kidney disease.  Have a skin condition called psoriasis. What are the signs or symptoms? An attack of acute gout happens quickly. It usually occurs in just one joint. The most common  place is the big toe. Attacks often start at night. Other joints that may be affected include joints of the feet, ankle, knee, fingers, wrist, or elbow. Symptoms of this condition may include:  Severe pain.  Warmth.  Swelling.  Stiffness.  Tenderness. The affected joint may be very painful to touch.  Shiny, red, or purple skin.  Chills and fever. Chronic gout may cause symptoms more frequently. More joints may be involved. You may also have white or yellow lumps (tophi) on your hands or feet or in other areas near your joints. How is this diagnosed? This condition is diagnosed based on your symptoms, medical history, and physical exam. You may have tests, such as:  Blood tests to measure uric acid levels.  Removal of joint fluid with a thin needle (aspiration) to look for uric acid crystals.  X-rays to look for joint damage. How is this treated? Treatment for this condition has two phases: treating an acute attack and preventing future attacks. Acute gout treatment may include medicines to reduce pain and swelling, including:  NSAIDs.  Steroids. These are strong anti-inflammatory medicines that can be taken by mouth (orally) or injected into a joint.  Colchicine. This medicine relieves pain and swelling when it is taken soon after an attack. It can be given by mouth or through an IV. Preventive treatment may include:  Daily use of smaller doses of NSAIDs or colchicine.  Use of a medicine that reduces uric acid levels in your blood.  Changes to your diet.  You may need to see a dietitian about what to eat and drink to prevent gout. Follow these instructions at home: During a gout attack   If directed, put ice on the affected area: ? Put ice in a plastic bag. ? Place a towel between your skin and the bag. ? Leave the ice on for 20 minutes, 2-3 times a day.  Raise (elevate) the affected joint above the level of your heart as often as possible.  Rest the joint as much as  possible. If the affected joint is in your leg, you may be given crutches to use.  Follow instructions from your health care provider about eating or drinking restrictions. Avoiding future gout attacks  Follow a low-purine diet as told by your dietitian or health care provider. Avoid foods and drinks that are high in purines, including liver, kidney, anchovies, asparagus, herring, mushrooms, mussels, and beer.  Maintain a healthy weight or lose weight if you are overweight. If you want to lose weight, talk with your health care provider. It is important that you do not lose weight too quickly.  Start or maintain an exercise program as told by your health care provider. Eating and drinking  Drink enough fluids to keep your urine pale yellow.  If you drink alcohol: ? Limit how much you use to:  0-1 drink a day for women.  0-2 drinks a day for men. ? Be aware of how much alcohol is in your drink. In the [place] one drink equals one 12 oz bottle of beer (355 mL) one 5 oz glass of wine (148 mL), or one 1 oz glass of hard liquor (44 mL). General instructions  Take over-the-counter and prescription medicines only as told by your health care provider.  Do not drive or use heavy machinery while taking prescription pain medicine.  Return to your normal activities as told by your health care provider. Ask your health care provider what activities are safe for you.  Keep all follow-up visits as told by your health care provider. This is important. Contact a health care provider if you have:  Another gout attack.  Continuing symptoms of a gout attack after 10 days of treatment.  Side effects from your medicines.  Chills or a fever.  Burning pain when you urinate.  Pain in your lower back or belly. Get help right away if you:  Have severe or uncontrolled pain.  Cannot urinate. Summary  Gout is painful swelling of the joints caused by inflammation.  The most common site of pain is  the big toe, but it can affect other joints in the body.  Medicines and dietary changes can help to prevent and treat gout attacks. This information is not intended to replace advice given to you by your health care provider. Make sure you discuss any questions you have with your health care provider. Document Released: 12/06/2000 Document Revised: 07/01/2018 Document Reviewed: 07/01/2018 Elsevier Interactive Patient Education  2019 [employer]  Low-Purine Eating Plan A low-purine eating plan involves making food choices to limit your intake of purine. Purine is a kind of uric acid. Too much uric acid in your blood can cause certain conditions, such as gout and kidney stones. Eating a low-purine diet can help control these conditions. What are tips for following this plan? Reading food labels   Avoid foods with saturated or Trans fat.  Check the ingredient list of grains-based foods, such as bread and cereal, to make sure that they contain whole grains.  Check the ingredient list of sauces or soups to make sure they do not contain meat or fish.  When choosing soft drinks, check the ingredient list to make sure they do not contain high-fructose corn syrup. Shopping  Buy plenty of fresh fruits and vegetables.  Avoid buying canned or fresh fish.  Buy dairy products labeled as low-fat or nonfat.  Avoid buying premade or processed foods. These foods are often high in fat, salt (sodium), and added sugar. Cooking  Use olive oil instead of butter when cooking. Oils like olive oil, canola oil, and sunflower oil contain healthy fats. Meal planning  Learn which foods do or do not affect you. If you find out that a food tends to cause your gout symptoms to flare up, avoid eating that food. You can enjoy foods that do not cause problems. If you have any questions about a food item, talk with your dietitian or health care provider.  Limit foods high in fat, especially saturated fat. Fat  makes it harder for your body to get rid of uric acid.  Choose foods that are lower in fat and are lean sources of protein. General guidelines  Limit alcohol intake to no more than 1 drink a day for nonpregnant women and 2 drinks a day for men. One drink equals 12 oz of beer, 5 oz of wine, or 1 oz of hard liquor. Alcohol can affect the way your body gets rid of uric acid.  Drink plenty of water to keep your urine clear or pale yellow. Fluids can help remove uric acid from your body.  If directed by your health care provider, take a vitamin C supplement.  Work with your health care provider and dietitian to develop a plan to achieve or maintain a healthy weight. Losing weight can help reduce uric acid in your blood. What foods are recommended? The items listed may not be a complete list. Talk with your dietitian about what dietary choices are best for you. Foods low in purines Foods low in purines do not need to be limited. These include:  All fruits.  All low-purine vegetables, pickles, and olives.  Breads, pasta, rice, cornbread, and popcorn. Cake and other baked goods.  All dairy foods.  Eggs, nuts, and nut butters.  Spices and condiments, such as salt, herbs, and vinegar.  Plant oils, butter, and margarine.  Water, sugar-free soft drinks, tea, coffee, and cocoa.  Vegetable-based soups, broths, sauces, and gravies. Foods moderate in purines Foods moderate in purines should be limited to the amounts listed.   cup of asparagus, cauliflower, spinach, mushrooms, or green peas, each day.  2/3 cup uncooked oatmeal, each day.   cup dry wheat bran or wheat germ, each day.  2-3 ounces of meat or poultry, each day.  4-6 ounces of shellfish, such as crab, lobster, oysters, or shrimp, each day.  1 cup cooked beans, peas, or lentils, each day.  Soup, broths, or bouillon made from meat or fish. Limit these foods as much as possible. What foods are not recommended? The items  listed may not be a complete list. Talk with your dietitian about what dietary choices are best for you. Limit your intake of foods high in purines, including:  Beer and other alcohol.  Meat-based gravy or sauce.  Canned or fresh fish, such as: ? Anchovies, sardines, herring, and tuna. ? Mussels and scallops. ? Codfish, trout, and haddock.  Tomasa Blase.  Organ meats, such as: ? Liver or kidney. ? Tripe. ? Sweetbreads (  thymus gland or pancreas).  Wild Education officer, environmental.  Yeast or yeast extract supplements.  Drinks sweetened with high-fructose corn syrup. Summary  Eating a low-purine diet can help control conditions caused by too much uric acid in the body, such as gout or kidney stones.  Choose low-purine foods, limit alcohol, and limit foods high in fat.  You will learn over time which foods do or do not affect you. If you find out that a food tends to cause your gout symptoms to flare up, avoid eating that food. This information is not intended to replace advice given to you by your health care provider. Make sure you discuss any questions you have with your health care provider. Document Released: 04/05/2011 Document Revised: 01/22/2017 Document Reviewed: 01/22/2017 Elsevier Interactive Patient Education  2019 [employer].

## 2019-02-24 NOTE — Progress Notes (Signed)
Assessment & Plan:  Nathan Rubio was seen today for follow-up.  Diagnoses and all orders for this visit:  Chronic gout of multiple sites, unspecified cause -     Uric Acid -     Basic metabolic panel  Libido, decreased -     Testosterone -     TSH  Elevated blood pressure reading -     TSH    Patient has been counseled on age-appropriate routine health concerns for screening and prevention. These are reviewed and up-to-date. Referrals have been placed accordingly. Immunizations are up-to-date or declined.    Subjective:   Chief Complaint  Patient presents with  . Follow-up    Pt. stated his joint pain and leg pain are much better.    HPI Nathan Rubio 48 y.o. male presents to office today for follow up to GOUT.  Patient was seen on 01-22-2019 to establish care. PMH: Gout with inflammatory polyarthropathy. He has not seen a rheumatologist since 2013. He has been taking a colchicine from [place] as needed. He will need to be referred to a rheumatologist due to his advance disease. He has been on as much as 700 mg of allopurinol per review of office notes from 07-23-2012. Previous medication regimen:  Allopurinol 500mg  daily, colchicine once daily and chronic prednisone 5mg  daily.  Today he endorses significant improvement in arthralgia. Taking allopurinol 100mg  daily and colchicine 0.6 mg BID.     Blood pressure is elevated today. No history of HTN. He is not taking any supplements. We discussed dietary and exercise modifications today. Will have him return for BP recheck in 2 weeks. Denies chest pain, shortness of breath, palpitations, lightheadedness, dizziness, headaches or BLE edema.  BP Readings from Last 3 Encounters:  02/24/19 (!) 143/103  01/22/19 121/88    Decreased Libido He has the ability to achieve and maintain an erection. However there is premature ejaculation usually within 5 minutes. He has significant decreased libido. Onset within the past month. His depression  screening is abnormal. He feels he is more stressed and anxious than depressed. No thoughts of self harm. Declines SSRI today.  Depression screen PHQ 2/9 02/24/2019  Decreased Interest 1  Down, Depressed, Hopeless 1  PHQ - 2 Score 2  Altered sleeping 1  Tired, decreased energy 2  Change in appetite 1  Feeling bad or failure about yourself  1  Trouble concentrating 0  Moving slowly or fidgety/restless 1  Suicidal thoughts 0  PHQ-9 Score 8   GAD 7 : Generalized Anxiety Score 02/24/2019  Nervous, Anxious, on Edge 0  Control/stop worrying 0  Worry too much - different things 1  Trouble relaxing 1  Restless 1  Easily annoyed or irritable 1  Afraid - awful might happen 1  Total GAD 7 Score 5    Review of Systems  Constitutional: Negative for fever, malaise/fatigue and weight loss.  HENT: Negative.  Negative for nosebleeds.   Eyes: Negative.  Negative for blurred vision, double vision and photophobia.  Respiratory: Negative.  Negative for cough and shortness of breath.   Cardiovascular: Negative.  Negative for chest pain, palpitations and leg swelling.  Gastrointestinal: Negative.  Negative for heartburn, nausea and vomiting.  Genitourinary:       Decreased libido  Musculoskeletal: Positive for joint pain. Negative for myalgias.  Neurological: Negative.  Negative for dizziness, focal weakness, seizures and headaches.  Psychiatric/Behavioral: Negative.  Negative for suicidal ideas.    Past Medical History:  Diagnosis Date  . Gout   .  Inflammatory polyarthritis (HCC)     History reviewed. No pertinent surgical history.  Family History  Problem Relation Age of Onset  . Depression Neg Hx   . Diabetes Neg Hx     Social History Reviewed with no changes to be made today.   Outpatient Medications Prior to Visit  Medication Sig Dispense Refill  . allopurinol (ZYLOPRIM) 100 MG tablet Take 1 tablet (100 mg total) by mouth daily. 30 tablet 6  . colchicine 0.6 MG tablet Take 1  tablet (0.6 mg total) by mouth 2 (two) times daily for 30 days. 60 tablet 3  . predniSONE (DELTASONE) 20 MG tablet Take 2 tablets by mouth once a day on days 1-2; take 1 tablet on days 3-4; take 1/2 tablet on days 5-6 then stop 8 tablet 0   No facility-administered medications prior to visit.     No Known Allergies     Objective:    BP (!) 143/103 (BP Location: Left Arm, Patient Position: Sitting, Cuff Size: Normal)   Pulse 80   Temp 98 F (36.7 C) (Oral)   Ht 5\' 6"  (1.676 m)   Wt 169 lb 3.2 oz (76.7 kg)   SpO2 97%   BMI 27.31 kg/m  Wt Readings from Last 3 Encounters:  02/24/19 169 lb 3.2 oz (76.7 kg)  01/22/19 169 lb 9.6 oz (76.9 kg)    Physical Exam Vitals signs and nursing note reviewed.  Constitutional:      Appearance: He is well-developed.  HENT:     Head: Normocephalic and atraumatic.  Neck:     Musculoskeletal: Normal range of motion.  Cardiovascular:     Rate and Rhythm: Normal rate and regular rhythm.     Heart sounds: Normal heart sounds. No murmur. No friction rub. No gallop.   Pulmonary:     Effort: Pulmonary effort is normal. No tachypnea or respiratory distress.     Breath sounds: Normal breath sounds. No decreased breath sounds, wheezing, rhonchi or rales.  Chest:     Chest wall: No tenderness.  Abdominal:     General: Bowel sounds are normal.     Palpations: Abdomen is soft.  Musculoskeletal: Normal range of motion.  Skin:    General: Skin is warm and dry.  Neurological:     Mental Status: He is alert and oriented to person, place, and time.     Coordination: Coordination normal.  Psychiatric:        Behavior: Behavior normal. Behavior is cooperative.        Thought Content: Thought content normal.        Judgment: Judgment normal.          Patient has been counseled extensively about nutrition and exercise as well as the importance of adherence with medications and regular follow-up. The patient was given clear instructions to go to ER or  return to medical center if symptoms don't improve, worsen or new problems develop. The patient verbalized understanding.   Follow-up: Return in about 2 weeks (around 03/10/2019) for decreased libido; elevated blood pressure.   Claiborne Rigg, FNP-BC [facility] [place], [place] [phone]   02/24/2019, 9:26 AM

## 2019-02-25 LAB — BASIC METABOLIC PANEL
BUN / CREAT RATIO: 13 (ref 9–20)
BUN: 21 mg/dL (ref 6–24)
CO2: 22 mmol/L (ref 20–29)
Calcium: 10.4 mg/dL — ABNORMAL HIGH (ref 8.7–10.2)
Chloride: 102 mmol/L (ref 96–106)
Creatinine, Ser: 1.58 mg/dL — ABNORMAL HIGH (ref 0.76–1.27)
GFR calc non Af Amer: 51 mL/min/{1.73_m2} — ABNORMAL LOW (ref >59)
GFR, EST AFRICAN AMERICAN: 59 mL/min/{1.73_m2} — AB (ref >59)
Glucose: 82 mg/dL (ref 65–99)
POTASSIUM: 4.4 mmol/L (ref 3.5–5.2)
Sodium: 142 mmol/L (ref 134–144)

## 2019-02-25 LAB — URIC ACID: Uric Acid: 11.2 mg/dL — ABNORMAL HIGH (ref 3.7–8.6)

## 2019-02-25 LAB — TSH: TSH: 0.243 u[IU]/mL — ABNORMAL LOW (ref 0.450–4.500)

## 2019-02-25 LAB — TESTOSTERONE: Testosterone: 424 ng/dL (ref 264–916)

## 2019-02-28 ENCOUNTER — Other Ambulatory Visit: Payer: Self-pay | Admitting: Nurse Practitioner

## 2019-02-28 DIAGNOSIS — R7989 Other specified abnormal findings of blood chemistry: Secondary | ICD-10-CM

## 2019-02-28 DIAGNOSIS — M1A49X0 Other secondary chronic gout, multiple sites, without tophus (tophi): Secondary | ICD-10-CM

## 2019-02-28 MED ORDER — COLCHICINE 0.6 MG PO TABS: 0.6000 mg | ORAL_TABLET | Freq: Two times a day (BID) | ORAL | 3 refills | Status: DC

## 2019-02-28 MED ORDER — ALLOPURINOL 300 MG PO TABS: 300.0000 mg | ORAL_TABLET | Freq: Every day | ORAL | 1 refills | Status: DC

## 2019-03-01 ENCOUNTER — Telehealth: Payer: Self-pay

## 2019-03-01 MED FILL — ALLOPURINOL 300 MG TAB: 300 | 30 days supply | Qty: 30 | Fill #0

## 2019-03-01 MED FILL — !COLCRYS 0.6 MG TABLET: 0.6 MG | 10 days supply | Qty: 20 | Fill #0

## 2019-03-01 NOTE — Telephone Encounter (Signed)
-----   Message from Claiborne Rigg, NP sent at 02/28/2019  9:38 PM EDT ----- Thyroid level is low and uric acid is high. Will need to increase allopurinol to 300 mg daily. Please make sure you have applied for the financial assistance as you will need to be referred to rheumatology. Testosterone level is normal. Please make a lab appointment to have your uric acid level checked in 4 weeks as well as thyroid function repeated. Your thyroid level is abnormal.

## 2019-03-01 NOTE — Telephone Encounter (Signed)
CMA spoke to patient. Patient was inform on lab results and PCP advising.  Pt. Stated he will his thyroid and uric acid level check again in his next ov in April.  Pt. Verified DOB.

## 2019-03-18 MED FILL — !COLCRYS 0.6 MG TABLET: 0.6 MG | 15 days supply | Qty: 30 | Fill #2

## 2019-03-26 ENCOUNTER — Ambulatory Visit: Payer: Self-pay | Admitting: Nurse Practitioner

## 2019-03-29 MED FILL — ALLOPURINOL 300 MG TAB: 300 | 30 days supply | Qty: 30 | Fill #1

## 2019-04-12 ENCOUNTER — Telehealth: Payer: Self-pay | Admitting: Nurse Practitioner

## 2019-04-12 NOTE — Telephone Encounter (Signed)
1) Medication(s) Requested (by name): Allopurinol  2) Pharmacy of Choice: [facility] 3) Special Requests:   Approved medications will be sent to the pharmacy, we will reach out if there is an issue.  Requests made after 3pm may not be addressed until the following business day!  If a patient is unsure of the name of the medication(s) please note and ask patient to call back when they are able to provide all info, do not send to responsible party until all information is available!

## 2019-04-13 ENCOUNTER — Ambulatory Visit: Payer: Self-pay | Admitting: Critical Care Medicine

## 2019-04-14 ENCOUNTER — Other Ambulatory Visit: Payer: Self-pay | Admitting: Nurse Practitioner

## 2019-04-14 DIAGNOSIS — M1A49X0 Other secondary chronic gout, multiple sites, without tophus (tophi): Secondary | ICD-10-CM

## 2019-04-14 MED ORDER — COLCHICINE 0.6 MG PO TABS
0.6000 mg | ORAL_TABLET | Freq: Two times a day (BID) | ORAL | 3 refills | Status: DC
Start: 2019-04-14 — End: 2019-06-01

## 2019-04-14 MED ORDER — ALLOPURINOL 300 MG PO TABS: 300.0000 mg | ORAL_TABLET | Freq: Every day | ORAL | 1 refills | Status: DC

## 2019-04-14 NOTE — Telephone Encounter (Signed)
Please call patient and let him know he has overdue labwork. Thanks

## 2019-04-19 MED FILL — !COLCRYS 0.6 MG TABLET: 0.6 MG | 10 days supply | Qty: 20 | Fill #1

## 2019-04-19 MED FILL — ALLOPURINOL 300 MG TAB: 300 | 30 days supply | Qty: 30 | Fill #0

## 2019-04-20 NOTE — Telephone Encounter (Signed)
CMA spoke to patient and informed lab work, Patient scheduled a lab appt.

## 2019-04-21 ENCOUNTER — Ambulatory Visit: Payer: Self-pay | Attending: Family Medicine

## 2019-04-21 ENCOUNTER — Other Ambulatory Visit: Payer: Self-pay

## 2019-04-21 DIAGNOSIS — R7989 Other specified abnormal findings of blood chemistry: Secondary | ICD-10-CM

## 2019-04-21 DIAGNOSIS — M1A49X0 Other secondary chronic gout, multiple sites, without tophus (tophi): Secondary | ICD-10-CM

## 2019-04-22 LAB — THYROID PANEL WITH TSH
Free Thyroxine Index: 1.3 (ref 1.2–4.9)
T3 Uptake Ratio: 31 % (ref 24–39)
T4, Total: 4.1 ug/dL — ABNORMAL LOW (ref 4.5–12.0)
TSH: 0.221 u[IU]/mL — ABNORMAL LOW (ref 0.450–4.500)

## 2019-04-22 LAB — URIC ACID: Uric Acid: 7.4 mg/dL (ref 3.7–8.6)

## 2019-04-27 ENCOUNTER — Other Ambulatory Visit: Payer: Self-pay | Admitting: Nurse Practitioner

## 2019-04-27 DIAGNOSIS — E038 Other specified hypothyroidism: Secondary | ICD-10-CM

## 2019-04-27 DIAGNOSIS — M1A9XX1 Chronic gout, unspecified, with tophus (tophi): Secondary | ICD-10-CM

## 2019-04-27 NOTE — Telephone Encounter (Signed)
-----   Message from Claiborne Rigg, NP sent at 04/25/2019  9:59 PM EDT ----- Uric acid levels are still slightly elevated and your thyroid levels are still too low. Will need to have additional lab work performed and will need to be referred to rheumatology. Please make sure you have applied for the financial assistance program. Also make a lab appointment to have additional blood work drawn.

## 2019-04-27 NOTE — Telephone Encounter (Signed)
CMA spoke to patient to inform on lab results and PCP advising.  Pt. Stated he cannot finish his application for [facility] due to they are not taking any appt. Due to COVID19.  Pt. Made a lab appt. On 05/19/2019 for additional bloodwork.

## 2019-05-04 ENCOUNTER — Ambulatory Visit: Payer: Self-pay | Admitting: Nurse Practitioner

## 2019-05-19 ENCOUNTER — Ambulatory Visit: Payer: Self-pay | Attending: Nurse Practitioner

## 2019-05-19 ENCOUNTER — Other Ambulatory Visit: Payer: Self-pay

## 2019-05-19 DIAGNOSIS — M1A9XX1 Chronic gout, unspecified, with tophus (tophi): Secondary | ICD-10-CM

## 2019-05-19 DIAGNOSIS — E038 Other specified hypothyroidism: Secondary | ICD-10-CM

## 2019-05-20 LAB — CMP14+EGFR
ALT: 12 IU/L (ref 0–44)
AST: 23 IU/L (ref 0–40)
Albumin/Globulin Ratio: 1.6 (ref 1.2–2.2)
Albumin: 4.8 g/dL (ref 4.0–5.0)
Alkaline Phosphatase: 100 IU/L (ref 39–117)
BUN/Creatinine Ratio: 10 (ref 9–20)
BUN: 16 mg/dL (ref 6–24)
Bilirubin Total: 0.4 mg/dL (ref 0.0–1.2)
CO2: 21 mmol/L (ref 20–29)
Calcium: 10 mg/dL (ref 8.7–10.2)
Chloride: 99 mmol/L (ref 96–106)
Creatinine, Ser: 1.66 mg/dL — ABNORMAL HIGH (ref 0.76–1.27)
GFR calc Af Amer: 56 mL/min/{1.73_m2} — ABNORMAL LOW (ref >59)
GFR calc non Af Amer: 48 mL/min/{1.73_m2} — ABNORMAL LOW (ref >59)
Globulin, Total: 3 g/dL (ref 1.5–4.5)
Glucose: 101 mg/dL — ABNORMAL HIGH (ref 65–99)
Potassium: 4 mmol/L (ref 3.5–5.2)
Sodium: 140 mmol/L (ref 134–144)
Total Protein: 7.8 g/dL (ref 6.0–8.5)

## 2019-05-20 LAB — ACTH: ACTH: 13.6 pg/mL (ref 7.2–63.3)

## 2019-05-20 LAB — URIC ACID: Uric Acid: 8.5 mg/dL (ref 3.7–8.6)

## 2019-05-25 MED FILL — ALLOPURINOL 300 MG TAB: 300 | 30 days supply | Qty: 30 | Fill #1

## 2019-05-25 MED FILL — !COLCRYS 0.6 MG TABLET: 0.6 MG | 10 days supply | Qty: 20 | Fill #2

## 2019-05-31 ENCOUNTER — Other Ambulatory Visit: Payer: Self-pay

## 2019-05-31 ENCOUNTER — Ambulatory Visit: Payer: Self-pay | Attending: Nurse Practitioner

## 2019-06-01 ENCOUNTER — Other Ambulatory Visit: Payer: Self-pay | Admitting: Nurse Practitioner

## 2019-06-01 DIAGNOSIS — M1A49X0 Other secondary chronic gout, multiple sites, without tophus (tophi): Secondary | ICD-10-CM

## 2019-06-01 MED ORDER — COLCHICINE 0.6 MG PO TABS: 0.6000 mg | ORAL_TABLET | Freq: Every day | ORAL | 1 refills | Status: DC

## 2019-06-01 MED ORDER — ALLOPURINOL 300 MG PO TABS: 300.0000 mg | ORAL_TABLET | Freq: Every day | ORAL | 1 refills | Status: DC

## 2019-06-07 ENCOUNTER — Telehealth: Payer: Self-pay

## 2019-06-07 NOTE — Telephone Encounter (Signed)
-----   Message from Gildardo Pounds, NP sent at 06/01/2019 10:46 PM EDT ----- Uric acid is still too high. It is very important that you apply for the financial assistance so that you can be referred to specialists. Please decrease your colchicine to one tablet daily and make a lab appointment in 6 weeks.

## 2019-06-07 NOTE — Telephone Encounter (Signed)
CMA spoke to patient to inform on results.  Pt. Verified DOB. Pt. Understood.   Pt. Have [facility], CMA verified with the financial assistance.

## 2019-06-08 ENCOUNTER — Other Ambulatory Visit: Payer: Self-pay | Admitting: Nurse Practitioner

## 2019-06-08 DIAGNOSIS — M1A09X0 Idiopathic chronic gout, multiple sites, without tophus (tophi): Secondary | ICD-10-CM

## 2019-07-05 MED FILL — ALLOPURINOL 300 MG TAB: 300 | 30 days supply | Qty: 30 | Fill #0

## 2019-07-05 MED FILL — !COLCRYS 0.6 MG TABLET: 0.6 MG | 10 days supply | Qty: 20 | Fill #3

## 2019-07-14 ENCOUNTER — Other Ambulatory Visit: Payer: Self-pay | Admitting: Nurse Practitioner

## 2019-07-14 ENCOUNTER — Telehealth: Payer: Self-pay | Admitting: Nurse Practitioner

## 2019-07-14 ENCOUNTER — Other Ambulatory Visit: Payer: Self-pay

## 2019-07-14 ENCOUNTER — Ambulatory Visit: Payer: Self-pay | Attending: Nurse Practitioner

## 2019-07-14 DIAGNOSIS — M109 Gout, unspecified: Secondary | ICD-10-CM

## 2019-07-14 NOTE — Telephone Encounter (Signed)
Dr. Bo Merino is Rheumatology she works in [employer]. They are [place]. Is pt able to see this provider?

## 2019-07-15 LAB — CMP14+EGFR
ALT: 15 IU/L (ref 0–44)
AST: 20 IU/L (ref 0–40)
Albumin/Globulin Ratio: 1.5 (ref 1.2–2.2)
Albumin: 4.6 g/dL (ref 4.0–5.0)
Alkaline Phosphatase: 107 IU/L (ref 39–117)
BUN/Creatinine Ratio: 11 (ref 9–20)
BUN: 17 mg/dL (ref 6–24)
Bilirubin Total: 0.4 mg/dL (ref 0.0–1.2)
CO2: 24 mmol/L (ref 20–29)
Calcium: 9.5 mg/dL (ref 8.7–10.2)
Chloride: 99 mmol/L (ref 96–106)
Creatinine, Ser: 1.6 mg/dL — ABNORMAL HIGH (ref 0.76–1.27)
GFR calc Af Amer: 58 mL/min/{1.73_m2} — ABNORMAL LOW (ref >59)
GFR calc non Af Amer: 51 mL/min/{1.73_m2} — ABNORMAL LOW (ref >59)
Globulin, Total: 3 g/dL (ref 1.5–4.5)
Glucose: 75 mg/dL (ref 65–99)
Potassium: 4.3 mmol/L (ref 3.5–5.2)
Sodium: 142 mmol/L (ref 134–144)
Total Protein: 7.6 g/dL (ref 6.0–8.5)

## 2019-07-15 LAB — TSH: TSH: 0.27 u[IU]/mL — ABNORMAL LOW (ref 0.450–4.500)

## 2019-07-15 LAB — LIPID PANEL
Chol/HDL Ratio: 5 ratio (ref 0.0–5.0)
Cholesterol, Total: 216 mg/dL — ABNORMAL HIGH (ref 100–199)
HDL: 43 mg/dL (ref >39)
LDL Calculated: 140 mg/dL — ABNORMAL HIGH (ref 0–99)
Triglycerides: 166 mg/dL — ABNORMAL HIGH (ref 0–149)
VLDL Cholesterol Cal: 33 mg/dL (ref 5–40)

## 2019-07-15 LAB — URIC ACID: Uric Acid: 7.4 mg/dL (ref 3.7–8.6)

## 2019-07-15 NOTE — Telephone Encounter (Signed)
I refer patient on 6/17 to Dr Marolyn Hammock and she reviewed  And they denied the patient.

## 2019-07-18 ENCOUNTER — Other Ambulatory Visit: Payer: Self-pay | Admitting: Nurse Practitioner

## 2019-07-18 DIAGNOSIS — E038 Other specified hypothyroidism: Secondary | ICD-10-CM

## 2019-07-18 DIAGNOSIS — E782 Mixed hyperlipidemia: Secondary | ICD-10-CM

## 2019-07-18 MED ORDER — ATORVASTATIN CALCIUM 20 MG PO TABS: 20.0000 mg | ORAL_TABLET | Freq: Every day | ORAL | 3 refills | Status: DC

## 2019-07-19 MED FILL — ?ATORVASTATIN 20 MG TABLET: 20 | 90 days supply | Qty: 90 | Fill #0

## 2019-08-09 MED FILL — !COLCRYS 0.6 MG TABLET: 0.6 MG | 10 days supply | Qty: 20 | Fill #4

## 2019-08-09 MED FILL — ALLOPURINOL 300 MG TAB: 300 | 30 days supply | Qty: 30 | Fill #1

## 2019-09-20 ENCOUNTER — Other Ambulatory Visit: Payer: Self-pay | Admitting: Nurse Practitioner

## 2019-09-20 DIAGNOSIS — M1A49X0 Other secondary chronic gout, multiple sites, without tophus (tophi): Secondary | ICD-10-CM

## 2019-09-20 MED FILL — $COLCRYS 0.6 MG TABLET: 0.6 | 45 days supply | Qty: 90 | Fill #5

## 2019-09-20 MED FILL — ALLOPURINOL 300 MG TAB: 300 | 30 days supply | Qty: 30 | Fill #0

## 2019-10-28 ENCOUNTER — Other Ambulatory Visit: Payer: Self-pay | Admitting: Family Medicine

## 2019-10-28 DIAGNOSIS — M1A49X0 Other secondary chronic gout, multiple sites, without tophus (tophi): Secondary | ICD-10-CM

## 2019-11-02 ENCOUNTER — Other Ambulatory Visit: Payer: Self-pay

## 2019-11-02 ENCOUNTER — Ambulatory Visit: Payer: Self-pay | Attending: Nurse Practitioner | Admitting: Nurse Practitioner

## 2019-11-02 ENCOUNTER — Encounter: Payer: Self-pay | Admitting: Nurse Practitioner

## 2019-11-02 VITALS — BP 144/103 | HR 97 | Ht 66.0 in | Wt 185.8 lb

## 2019-11-02 DIAGNOSIS — E782 Mixed hyperlipidemia: Secondary | ICD-10-CM

## 2019-11-02 DIAGNOSIS — M1A9XX1 Chronic gout, unspecified, with tophus (tophi): Secondary | ICD-10-CM

## 2019-11-02 DIAGNOSIS — M064 Inflammatory polyarthropathy: Secondary | ICD-10-CM

## 2019-11-02 DIAGNOSIS — E038 Other specified hypothyroidism: Secondary | ICD-10-CM

## 2019-11-02 MED ORDER — ATORVASTATIN CALCIUM 20 MG PO TABS: 20.0000 mg | ORAL_TABLET | Freq: Every day | ORAL | 3 refills | Status: DC

## 2019-11-02 MED ORDER — ALLOPURINOL 300 MG PO TABS: 300.0000 mg | ORAL_TABLET | Freq: Every day | ORAL | 0 refills | Status: DC

## 2019-11-02 MED ORDER — COLCHICINE 0.6 MG PO TABS: 0.6000 mg | ORAL_TABLET | Freq: Every day | ORAL | 1 refills | Status: DC

## 2019-11-02 MED FILL — !COLCRYS 0.6 MG TABLET: 0.6 MG | 30 days supply | Qty: 30 | Fill #0

## 2019-11-02 MED FILL — ALLOPURINOL 300 MG TAB: 300 | 30 days supply | Qty: 30 | Fill #0

## 2019-11-02 MED FILL — ?ATORVASTATIN 20 MG TABLET: 20 | 30 days supply | Qty: 30 | Fill #0

## 2019-11-02 NOTE — Patient Instructions (Signed)
Rheumatoid Arthritis Rheumatoid arthritis (RA) is a long-term (chronic) disease. RA causes inflammation in your joints. Your joints may feel painful, stiff, swollen, and warm. RA may start slowly. It most often affects the small joints of the hands and feet. It can also affect other parts of the body. Symptoms of RA often come and go. There is no cure for RA, but medicines can help your symptoms. What are the causes?  RA is an autoimmune disease. This means that your body's defense system (immune system) attacks healthy parts of your body by mistake. The exact cause of RA is not known. What increases the risk?  Being a woman.  Having a family history of RA or other diseases like RA.  Smoking.  Being overweight.  Being exposed to pollutants or chemicals. What are the signs or symptoms?  Morning stiffness that lasts longer than 30 minutes. This is often the first symptom.  Symptoms start slowly. They are often worse in the morning.  As RA gets worse, symptoms may include: ? Pain, stiffness, swelling, warmth, and tenderness in joints on both sides of your body. ? Loss of energy. ? Not feeling hungry. ? Weight loss. ? A low fever. ? Dry eyes and a dry mouth. ? Firm lumps that grow under your skin. ? Changes in the way your joints look. ? Changes in the way your joints work.  Symptoms vary and they: ? Often come and go. ? Sometimes get worse for a period of time. These are called flares. How is this treated?   Treatment may include: ? Taking good care of yourself. Be sure to rest as needed, eat a healthy diet, and exercise. ? Medicines. These may include:  Pain relievers.  Medicines to help with inflammation.  Disease-modifying antirheumatic drugs (DMARDs).  Medicines called biologic response modifiers. ? Physical therapy and occupational therapy. ? Surgery, if joint damage is very bad. Your doctor will work with you to find the best treatments. Follow these  instructions at home: Activity  Return to your normal activities as told by your doctor. Ask your doctor what activities are safe for you.  Rest when you have a flare.  Exercise as told by your doctor. General instructions  Take over-the-counter and prescription medicines only as told by your doctor.  Keep all follow-up visits as told by your doctor. This is important. Where to find more information  [employer]: www.rheumatology.org  Arthritis Foundation: www.arthritis.org Contact a doctor if:  You have a flare.  You have a fever.  You have problems because of your medicines. Get help right away if:  You have chest pain.  You have trouble breathing.  You get a hot, painful joint all of a sudden, and it is worse than your normal joint aches. Summary  RA is a long-term disease.  Symptoms of RA start slowly. They are often worse in the morning.  RA causes inflammation in your joints. This information is not intended to replace advice given to you by your health care provider. Make sure you discuss any questions you have with your health care provider. Document Released: 03/02/2012 Document Revised: 08/12/2018 Document Reviewed: 08/12/2018 Elsevier Patient Education  2020 Elsevier Inc.  Osteoarthritis  Osteoarthritis is a type of arthritis that affects tissue that covers the ends of bones in joints (cartilage). Cartilage acts as a cushion between the bones and helps them move smoothly. Osteoarthritis results when cartilage in the joints gets worn down. Osteoarthritis is sometimes called "wear and tear"  arthritis. Osteoarthritis is the most common form of arthritis. It often occurs in older people. It is a condition that gets worse over time (a progressive condition). Joints that are most often affected by this condition are in:  Fingers.  Toes.  Hips.  Knees.  Spine, including neck and lower back. What are the causes? This condition is  caused by age-related wearing down of cartilage that covers the ends of bones. What increases the risk? The following factors may make you more likely to develop this condition:  Older age.  Being overweight or obese.  Overuse of joints, such as in athletes.  Past injury of a joint.  Past surgery on a joint.  Family history of osteoarthritis. What are the signs or symptoms? The main symptoms of this condition are pain, swelling, and stiffness in the joint. The joint may lose its shape over time. Small pieces of bone or cartilage may break off and float inside of the joint, which may cause more pain and damage to the joint. Small deposits of bone (osteophytes) may grow on the edges of the joint. Other symptoms may include:  A grating or scraping feeling inside the joint when you move it.  Popping or creaking sounds when you move. Symptoms may affect one or more joints. Osteoarthritis in a major joint, such as your knee or hip, can make it painful to walk or exercise. If you have osteoarthritis in your hands, you might not be able to grip items, twist your hand, or control small movements of your hands and fingers (fine motor skills). How is this diagnosed? This condition may be diagnosed based on:  Your medical history.  A physical exam.  Your symptoms.  X-rays of the affected joint(s).  Blood tests to rule out other types of arthritis. How is this treated? There is no cure for this condition, but treatment can help to control pain and improve joint function. Treatment plans may include:  A prescribed exercise program that allows for rest and joint relief. You may work with a physical therapist.  A weight control plan.  Pain relief techniques, such as: ? Applying heat and cold to the joint. ? Electric pulses delivered to nerve endings under the skin (transcutaneous electrical nerve stimulation, or TENS). ? Massage. ? Certain nutritional supplements.  NSAIDs or  prescription medicines to help relieve pain.  Medicine to help relieve pain and inflammation (corticosteroids). This can be given by mouth (orally) or as an injection.  Assistive devices, such as a brace, wrap, splint, specialized glove, or cane.  Surgery, such as: ? An osteotomy. This is done to reposition the bones and relieve pain or to remove loose pieces of bone and cartilage. ? Joint replacement surgery. You may need this surgery if you have very bad (advanced) osteoarthritis. Follow these instructions at home: Activity  Rest your affected joints as directed by your health care provider.  Do not drive or use heavy machinery while taking prescription pain medicine.  Exercise as directed. Your health care provider or physical therapist may recommend specific types of exercise, such as: ? Strengthening exercises. These are done to strengthen the muscles that support joints that are affected by arthritis. They can be performed with weights or with exercise bands to add resistance. ? Aerobic activities. These are exercises, such as brisk walking or water aerobics, that get your heart pumping. ? Range-of-motion activities. These keep your joints easy to move. ? Balance and agility exercises. Managing pain, stiffness, and swelling  If directed, apply heat to the affected area as often as told by your health care provider. Use the heat source that your health care provider recommends, such as a moist heat pack or a heating pad. ? If you have a removable assistive device, remove it as told by your health care provider. ? Place a towel between your skin and the heat source. If your health care provider tells you to keep the assistive device on while you apply heat, place a towel between the assistive device and the heat source. ? Leave the heat on for 20-30 minutes. ? Remove the heat if your skin turns bright red. This is especially important if you are unable to feel pain, heat, or  cold. You may have a greater risk of getting burned.  If directed, put ice on the affected joint: ? If you have a removable assistive device, remove it as told by your health care provider. ? Put ice in a plastic bag. ? Place a towel between your skin and the bag. If your health care provider tells you to keep the assistive device on during icing, place a towel between the assistive device and the bag. ? Leave the ice on for 20 minutes, 2-3 times a day. General instructions  Take over-the-counter and prescription medicines only as told by your health care provider.  Maintain a healthy weight. Follow instructions from your health care provider for weight control. These may include dietary restrictions.  Do not use any products that contain nicotine or tobacco, such as cigarettes and e-cigarettes. These can delay bone healing. If you need help quitting, ask your health care provider.  Use assistive devices as directed by your health care provider.  Keep all follow-up visits as told by your health care provider. This is important. Where to find more information  [employer] of Arthritis and Musculoskeletal and Skin Diseases: www.niams.SouthExposed.es  [employer] on Aging: http://kim-miller.com/  American College of Rheumatology: www.rheumatology.org Contact a health care provider if:  Your skin turns red.  You develop a rash.  You have pain that gets worse.  You have a fever along with joint or muscle aches. Get help right away if:  You lose a lot of weight.  You suddenly lose your appetite.  You have night sweats. Summary  Osteoarthritis is a type of arthritis that affects tissue covering the ends of bones in joints (cartilage).  This condition is caused by age-related wearing down of cartilage that covers the ends of bones.  The main symptom of this condition is pain, swelling, and stiffness in the joint.  There is no cure for this condition, but treatment can help to  control pain and improve joint function. This information is not intended to replace advice given to you by your health care provider. Make sure you discuss any questions you have with your health care provider. Document Released: 12/09/2005 Document Revised: 11/21/2017 Document Reviewed: 08/12/2016 Elsevier Patient Education  2020 [employer].

## 2019-11-02 NOTE — Progress Notes (Signed)
Med refills

## 2019-11-02 NOTE — Progress Notes (Signed)
Assessment & Plan:  Diagnoses and all orders for this visit:  Chronic gout with tophus, unspecified cause, unspecified site -     Uric Acid -     allopurinol (ZYLOPRIM) 300 MG tablet; Take 1 tablet (300 mg total) by mouth daily. Must have office visit for refills -     colchicine 0.6 MG tablet; Take 1 tablet (0.6 mg total) by mouth daily.  Low TSH level -     Thyroid Panel With TSH -     ACTH  Central hypothyroidism -     Cancel: Ambulatory referral to Rheumatology -     ACTH  Inflammatory polyarthropathy ([place]) -     Ambulatory referral to Rheumatology -     allopurinol (ZYLOPRIM) 300 MG tablet; Take 1 tablet (300 mg total) by mouth daily. Must have office visit for refills  Mixed hyperlipidemia -     atorvastatin (LIPITOR) 20 MG tablet; Take 1 tablet (20 mg total) by mouth daily. INSTRUCTIONS: Work on a low fat, heart healthy diet and participate in regular aerobic exercise program by working out at least 150 minutes per week; 5 days a week-30 minutes per day. Avoid red meat/beef/steak,  fried foods. junk foods, sodas, sugary drinks, unhealthy snacking, alcohol and smoking.  Drink at least 80 oz of water per day and monitor your carbohydrate intake daily.    Patient has been counseled on age-appropriate routine health concerns for screening and prevention. These are reviewed and up-to-date. Referrals have been placed accordingly. Immunizations are up-to-date or declined.    Subjective:  No chief complaint on file.  HPI Nathan Rubio 48 y.o. male presents to office today for follow up.   has a past medical history of Gout and Inflammatory polyarthritis ([place]).   GOUT Gout with inflammatory polyarthropathy. He has not seen a rheumatologist since 2013. He has been taking a colchicine from [place] as needed. He will need to be referred to a rheumatologist due to his advance disease. He has been on as much as 700 mg of allopurinol per review of office notes from 07-23-2012. Previous  medication regimen:Allopurinol 500mg  daily, colchicine once daily and chronic prednisone 5mg  daily.  Today he endorses significant improvement in arthralgia. Taking allopurinol 300mg  daily and colchicine 0.6 mg BID.  Uric acid remains elevated.  Lab Results  Component Value Date   LABURIC 8.7 (H) 11/02/2019   Elevated Blood Pressure Blood pressure elevated today. We discussed dietary and exercise modifications prior to starting on antihypertensive. Will recheck at next office visit. He currently does not take any blood pressure lowering medications.  BP Readings from Last 3 Encounters:  11/02/19 (!) 144/103  02/24/19 (!) 143/103  01/22/19 121/88   Hyperlipidemia Patient presents for follow up to hyperlipidemia.  He is medication compliant taking atorvastatin 20 mg daily. He is not consistently diet compliant and denies statin intolerance including myalgias.  Lab Results  Component Value Date   CHOL 216 (H) 07/14/2019   Lab Results  Component Value Date   HDL 43 07/14/2019   Lab Results  Component Value Date   LDLCALC 140 (H) 07/14/2019   Lab Results  Component Value Date   TRIG 166 (H) 07/14/2019   Lab Results  Component Value Date   CHOLHDL 5.0 07/14/2019   Review of Systems  Constitutional: Negative for fever, malaise/fatigue and weight loss.  HENT: Negative.  Negative for nosebleeds.   Eyes: Negative.  Negative for blurred vision, double vision and photophobia.  Respiratory: Negative.  Negative  for cough and shortness of breath.   Cardiovascular: Negative.  Negative for chest pain, palpitations and leg swelling.  Gastrointestinal: Negative.  Negative for heartburn, nausea and vomiting.  Musculoskeletal: Positive for joint pain. Negative for myalgias.  Neurological: Negative.  Negative for dizziness, focal weakness, seizures and headaches.  Psychiatric/Behavioral: Negative.  Negative for suicidal ideas.    Past Medical History:  Diagnosis Date  . Gout   .  Inflammatory polyarthritis ([facility])     No past surgical history on file.  Family History  Problem Relation Age of Onset  . Depression Neg Hx   . Diabetes Neg Hx     Social History Reviewed with no changes to be made today.   Outpatient Medications Prior to Visit  Medication Sig Dispense Refill  . allopurinol (ZYLOPRIM) 300 MG tablet Take 1 tablet (300 mg total) by mouth daily. Must have office visit for refills 30 tablet 0  . atorvastatin (LIPITOR) 20 MG tablet Take 1 tablet (20 mg total) by mouth daily. 90 tablet 3  . colchicine 0.6 MG tablet Take 1 tablet (0.6 mg total) by mouth daily for 30 days. 30 tablet 1   No facility-administered medications prior to visit.     No Known Allergies     Objective:    BP (!) 144/103 (BP Location: Left Arm)   Pulse 97   Ht 5\' 6"  (1.676 m)   Wt 185 lb 12.8 oz (84.3 kg)   SpO2 100%   BMI 29.99 kg/m  Wt Readings from Last 3 Encounters:  11/02/19 185 lb 12.8 oz (84.3 kg)  02/24/19 169 lb 3.2 oz (76.7 kg)  01/22/19 169 lb 9.6 oz (76.9 kg)    Physical Exam Vitals signs and nursing note reviewed.  Constitutional:      Appearance: He is well-developed.  HENT:     Head: Normocephalic and atraumatic.  Neck:     Musculoskeletal: Normal range of motion.  Cardiovascular:     Rate and Rhythm: Normal rate and regular rhythm.     Heart sounds: Normal heart sounds. No murmur. No friction rub. No gallop.   Pulmonary:     Effort: Pulmonary effort is normal. No tachypnea or respiratory distress.     Breath sounds: Normal breath sounds. No decreased breath sounds, wheezing, rhonchi or rales.  Chest:     Chest wall: No tenderness.  Abdominal:     General: Bowel sounds are normal.     Palpations: Abdomen is soft.  Musculoskeletal: Normal range of motion.        General: No swelling or deformity.  Skin:    General: Skin is warm and dry.  Neurological:     Mental Status: He is alert and oriented to person, place, and time.     Coordination:  Coordination normal.  Psychiatric:        Behavior: Behavior normal. Behavior is cooperative.        Thought Content: Thought content normal.        Judgment: Judgment normal.          Patient has been counseled extensively about nutrition and exercise as well as the importance of adherence with medications and regular follow-up. The patient was given clear instructions to go to ER or return to medical center if symptoms don't improve, worsen or new problems develop. The patient verbalized understanding.   Follow-up: Return in about 3 months (around 02/02/2020).   04/01/2020, FNP-BC [facility] and [facility] [place], [place]

## 2019-11-03 LAB — THYROID PANEL WITH TSH
Free Thyroxine Index: 1.5 (ref 1.2–4.9)
T3 Uptake Ratio: 31 % (ref 24–39)
T4, Total: 4.7 ug/dL (ref 4.5–12.0)
TSH: 0.279 u[IU]/mL — ABNORMAL LOW (ref 0.450–4.500)

## 2019-11-03 LAB — ACTH: ACTH: 13.5 pg/mL (ref 7.2–63.3)

## 2019-11-03 LAB — URIC ACID: Uric Acid: 8.7 mg/dL — ABNORMAL HIGH (ref 3.7–8.6)

## 2019-11-05 ENCOUNTER — Telehealth (INDEPENDENT_AMBULATORY_CARE_PROVIDER_SITE_OTHER): Payer: Self-pay

## 2019-11-05 NOTE — Telephone Encounter (Signed)
-----   Message from Gildardo Pounds, NP sent at 11/03/2019 12:45 PM EST ----- Uric acid level is higher than 3 months ago. Continue allopurinol and also add colchicine daily. Thyroid level at this time is slowly improving. Will recheck in 6 months.

## 2019-11-05 NOTE — Telephone Encounter (Signed)
Patient verified date of birth. He is aware that uric acid level is higher than 3 months ago. Advised to continue allopurinol also added colchicine daily.thyroid level is slowly improving. We will recheck in 6 months. He expressed understanding of results. Nat Christen, CMA

## 2019-11-09 ENCOUNTER — Encounter: Payer: Self-pay | Admitting: Nurse Practitioner

## 2019-12-06 ENCOUNTER — Telehealth: Payer: Self-pay | Admitting: Nurse Practitioner

## 2019-12-06 NOTE — Telephone Encounter (Signed)
Patient asked for a call back about labs

## 2019-12-09 NOTE — Telephone Encounter (Signed)
Spoke to patient. Patient stated he was not sure who called him, but the referral coordinator was calling him regarding his Rheumatologist referral.

## 2019-12-13 MED FILL — ALLOPURINOL 300 MG TAB: 300 | 30 days supply | Qty: 30 | Fill #1

## 2020-01-27 MED FILL — ALLOPURINOL 300 MG TAB: 300 | 30 days supply | Qty: 30 | Fill #2

## 2020-01-27 MED FILL — $COLCRYS 0.6 MG TABLET: 0.6 | 25 days supply | Qty: 50 | Fill #6

## 2020-02-02 ENCOUNTER — Ambulatory Visit: Payer: Self-pay | Admitting: Nurse Practitioner

## 2020-02-11 MED FILL — ?ALLOPURINOL 100MG TABLET: 100 | 30 days supply | Qty: 30 | Fill #0

## 2020-03-07 ENCOUNTER — Other Ambulatory Visit: Payer: Self-pay | Admitting: Nurse Practitioner

## 2020-03-07 DIAGNOSIS — M1A9XX1 Chronic gout, unspecified, with tophus (tophi): Secondary | ICD-10-CM

## 2020-03-07 MED FILL — ?ALLOPURINOL 100MG TABLET: 100 | 30 days supply | Qty: 30 | Fill #1

## 2020-03-09 MED FILL — ?ALLOPURINOL 300 MG TABLET: 300 | 30 days supply | Qty: 30 | Fill #0

## 2020-03-17 MED FILL — LOSARTAN POTASSIUM 50 MG TA: 50 | 30 days supply | Qty: 30 | Fill #0

## 2020-03-20 DIAGNOSIS — N183 Chronic kidney disease, stage 3 unspecified: Secondary | ICD-10-CM | POA: Insufficient documentation

## 2020-03-20 DIAGNOSIS — M1A09X1 Idiopathic chronic gout, multiple sites, with tophus (tophi): Secondary | ICD-10-CM | POA: Insufficient documentation

## 2020-03-20 DIAGNOSIS — I10 Essential (primary) hypertension: Secondary | ICD-10-CM | POA: Insufficient documentation

## 2020-04-03 MED FILL — ?ALLOPURINOL 300 MG TABLET: 300 | 30 days supply | Qty: 30 | Fill #1

## 2020-04-03 MED FILL — ALLOPURINOL 100 MG TABLET: 100 | 90 days supply | Qty: 90 | Fill #0

## 2020-04-10 MED FILL — LOSARTAN POTASSIUM 50 MG TA: 50 | 30 days supply | Qty: 30 | Fill #1

## 2020-04-14 MED FILL — ?ALLOPURINOL 300 MG TABLET: 300 | 30 days supply | Qty: 60 | Fill #0

## 2020-05-11 MED FILL — LOSARTAN POTASSIUM 50 MG TA: 50 | 30 days supply | Qty: 30 | Fill #2

## 2020-06-13 MED FILL — LOSARTAN POTASSIUM 50 MG TA: 50 | 30 days supply | Qty: 30 | Fill #3

## 2020-06-28 MED FILL — ALLOPURINOL 300 MG TAB: 300 | 30 days supply | Qty: 60 | Fill #2

## 2020-07-03 MED FILL — LOSARTAN POTASSIUM 50 MG TA: 50 | 30 days supply | Qty: 30 | Fill #4

## 2020-07-27 MED FILL — ALLOPURINOL 300 MG TAB: 300 | 30 days supply | Qty: 60 | Fill #3

## 2020-07-31 MED FILL — LOSARTAN POTASSIUM 50 MG TA: 50 | 30 days supply | Qty: 30 | Fill #5

## 2020-08-29 MED FILL — LOSARTAN POTASSIUM 50 MG TA: 50 | 30 days supply | Qty: 30 | Fill #6

## 2020-08-29 MED FILL — ?ALLOPURINOL 300 MG TABLET: 300 | 30 days supply | Qty: 30 | Fill #2

## 2020-09-01 ENCOUNTER — Telehealth: Payer: Self-pay | Admitting: Nurse Practitioner

## 2020-09-01 NOTE — Telephone Encounter (Signed)
Pt was prescribed Allopurinol 600MG  from his rheumatologist and they gave him a small courtesy refill for two weeks and he was advised by Dr. at [facility] to call [facility] and get this refilled / pt was referred by Zelda to this Rheumatologist

## 2020-09-01 NOTE — Telephone Encounter (Signed)
Pt was called and explain that he need to see the PCP first to schedule an appt them after he see the PCP can schedule a financial appt

## 2020-09-01 NOTE — Telephone Encounter (Signed)
Copied from CRM [id number]. Topic: General - Other >> Sep 01, 2020 12:20 PM Wyonia Hough E wrote: Reason for CRM:  Pt would like to schedule with Nathan Rubio to renew his orange card / please advise

## 2020-09-08 ENCOUNTER — Telehealth: Payer: Self-pay

## 2020-09-08 NOTE — Telephone Encounter (Signed)
Pt made appt [name] 10/11/20. Pt request short supply Allopurinol to [facility] pharmacy.Marland Kitchen

## 2020-09-11 ENCOUNTER — Other Ambulatory Visit: Payer: Self-pay | Admitting: Nurse Practitioner

## 2020-09-11 DIAGNOSIS — M1A9XX1 Chronic gout, unspecified, with tophus (tophi): Secondary | ICD-10-CM

## 2020-09-11 DIAGNOSIS — E782 Mixed hyperlipidemia: Secondary | ICD-10-CM

## 2020-09-11 MED ORDER — ALLOPURINOL 300 MG PO TABS: 300.0000 mg | ORAL_TABLET | Freq: Every day | ORAL | 0 refills | Status: DC

## 2020-09-11 MED ORDER — COLCHICINE 0.6 MG PO TABS: 0.6000 mg | ORAL_TABLET | Freq: Every day | ORAL | 1 refills | Status: DC

## 2020-09-11 MED ORDER — ATORVASTATIN CALCIUM 20 MG PO TABS: 20.0000 mg | ORAL_TABLET | Freq: Every day | ORAL | 3 refills | Status: DC

## 2020-09-11 NOTE — Telephone Encounter (Signed)
SENT 

## 2020-09-12 MED FILL — COLCHICINE 0.6 MG TABS: 0.6 | 30 days supply | Qty: 30 | Fill #0

## 2020-09-12 MED FILL — ATORVASTATIN CALCIUM 20 MG: 20 | 30 days supply | Qty: 30 | Fill #0

## 2020-09-15 ENCOUNTER — Other Ambulatory Visit: Payer: Self-pay | Admitting: Nurse Practitioner

## 2020-09-15 DIAGNOSIS — M1A9XX1 Chronic gout, unspecified, with tophus (tophi): Secondary | ICD-10-CM

## 2020-09-15 MED ORDER — ALLOPURINOL 300 MG PO TABS: 600.0000 mg | ORAL_TABLET | Freq: Every day | ORAL | 0 refills | Status: DC

## 2020-09-15 MED FILL — ALLOPURINOL 300 MG TAB: 300 | 30 days supply | Qty: 60 | Fill #0

## 2020-10-11 ENCOUNTER — Other Ambulatory Visit: Payer: Self-pay

## 2020-10-11 ENCOUNTER — Ambulatory Visit: Payer: HRSA Program | Attending: Nurse Practitioner | Admitting: Nurse Practitioner

## 2020-10-11 ENCOUNTER — Other Ambulatory Visit: Payer: Self-pay | Admitting: Nurse Practitioner

## 2020-10-11 ENCOUNTER — Encounter: Payer: Self-pay | Admitting: Nurse Practitioner

## 2020-10-11 VITALS — BP 130/88 | HR 88 | Temp 97.7°F | Ht 66.0 in | Wt 183.6 lb

## 2020-10-11 DIAGNOSIS — N1832 Chronic kidney disease, stage 3b: Secondary | ICD-10-CM

## 2020-10-11 DIAGNOSIS — R7989 Other specified abnormal findings of blood chemistry: Secondary | ICD-10-CM

## 2020-10-11 DIAGNOSIS — M1A9XX1 Chronic gout, unspecified, with tophus (tophi): Secondary | ICD-10-CM

## 2020-10-11 DIAGNOSIS — I10 Essential (primary) hypertension: Secondary | ICD-10-CM

## 2020-10-11 DIAGNOSIS — Z130 Encounter for screening for diseases of the blood and blood-forming organs and certain disorders involving the immune mechanism: Secondary | ICD-10-CM

## 2020-10-11 DIAGNOSIS — Z1159 Encounter for screening for other viral diseases: Secondary | ICD-10-CM

## 2020-10-11 DIAGNOSIS — M1A49X0 Other secondary chronic gout, multiple sites, without tophus (tophi): Secondary | ICD-10-CM

## 2020-10-11 DIAGNOSIS — Z13220 Encounter for screening for lipoid disorders: Secondary | ICD-10-CM

## 2020-10-11 MED ORDER — LOSARTAN POTASSIUM 50 MG PO TABS: 50.0000 mg | ORAL_TABLET | Freq: Every day | ORAL | 1 refills | Status: DC

## 2020-10-11 MED ORDER — COLCHICINE 0.6 MG PO TABS: 0.6000 mg | ORAL_TABLET | Freq: Every day | ORAL | 3 refills | Status: DC

## 2020-10-11 MED ORDER — ALLOPURINOL 300 MG PO TABS: 600.0000 mg | ORAL_TABLET | Freq: Every day | ORAL | 0 refills | Status: DC

## 2020-10-11 NOTE — Progress Notes (Signed)
Assessment & Plan:  Nathan Rubio was seen today for medication refill.  Diagnoses and all orders for this visit:  Chronic gout with tophus, unspecified cause, unspecified site -     allopurinol (ZYLOPRIM) 300 MG tablet; Take 2 tablets (600 mg total) by mouth daily. -     Discontinue: colchicine 0.6 MG tablet; Take 1 tablet (0.6 mg total) by mouth daily.  Stage 3b chronic kidney disease (HCC) -     losartan (COZAAR) 50 MG tablet; Take 1 tablet (50 mg total) by mouth daily.  Need for hepatitis C screening test -     Hepatitis C Antibody  Decreased thyroid stimulating hormone level -     T3, Free -     T4, Free -     TSH  Other secondary chronic gout of multiple sites without tophus -     Uric Acid  Screening for deficiency anemia -     CBC  Essential hypertension -     CMP14+EGFR -     losartan (COZAAR) 50 MG tablet; Take 1 tablet (50 mg total) by mouth daily. Continue all antihypertensives as prescribed.  Remember to bring in your blood pressure log with you for your follow up appointment.  DASH/Mediterranean Diets are healthier choices for HTN.    Lipid screening -     Lipid panel    Patient has been counseled on age-appropriate routine health concerns for screening and prevention. These are reviewed and up-to-date. Referrals have been placed accordingly. Immunizations are up-to-date or declined.    Subjective:   Chief Complaint  Patient presents with  . Medication Refill    Pt. is asking refill on his Losartan 14m.    HPI HDekari Rubio 49y.o. male presents to office today for follow-up.  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  I had referred him to rheumatology and nephrology however he is currently uninsured and today states that he cannot afford to see the specialist.  Recommendations for his tophaceous gout and CKD were allopurinol 600 mg daily with as needed colchicine and losartan 50 mg daily.  He does state when his allopurinol is  prescribed as 300 mg only he has severe joint pain.  He will need to follow-up with rheumatology and I have encouraged him to do so as his allopurinol needs to be weaned down.  Patient has been advised to apply for financial assistance and schedule to see our financial counselor.  Lab Results  Component Value Date   LABURIC 4.9 10/11/2020   BP Readings from Last 3 Encounters:  10/11/20 130/88  11/02/19 (!) 144/103  02/24/19 (!) 143/103   Lab Results  Component Value Date   CHOL 216 (H) 10/11/2020   CHOL 216 (H) 07/14/2019   CHOL 153 04/03/2010   Lab Results  Component Value Date   HDL 41 10/11/2020   HDL 43 07/14/2019   HDL 46 04/03/2010   Lab Results  Component Value Date   LDLCALC 131 (H) 10/11/2020   LDLCALC 140 (H) 07/14/2019   LDLCALC 84 04/03/2010   Lab Results  Component Value Date   TRIG 244 (H) 10/11/2020   TRIG 166 (H) 07/14/2019   TRIG 116 04/03/2010   Lab Results  Component Value Date   CHOLHDL 5.3 (H) 10/11/2020   CHOLHDL 5.0 07/14/2019   CHOLHDL 3.3 Ratio 04/03/2010     Review of Systems  Constitutional: Negative for fever, malaise/fatigue and weight loss.  HENT: Negative.  Negative for nosebleeds.  Eyes: Negative.  Negative for blurred vision, double vision and photophobia.  Respiratory: Negative.  Negative for cough and shortness of breath.   Cardiovascular: Negative.  Negative for chest pain, palpitations and leg swelling.  Gastrointestinal: Negative.  Negative for heartburn, nausea and vomiting.  Musculoskeletal: Negative.  Negative for myalgias.  Neurological: Negative.  Negative for dizziness, focal weakness, seizures and headaches.  Psychiatric/Behavioral: Negative.  Negative for suicidal ideas.    Past Medical History:  Diagnosis Date  . Gout   . Inflammatory polyarthritis ([place])     History reviewed. No pertinent surgical history.  Family History  Problem Relation Age of Onset  . Depression Neg Hx   . Diabetes Neg Hx      Social History Reviewed with no changes to be made today.   Outpatient Medications Prior to Visit  Medication Sig Dispense Refill  . allopurinol (ZYLOPRIM) 300 MG tablet Take 2 tablets (600 mg total) by mouth daily. Must have office visit WITH RHEUMATOLOGY for refills 60 tablet 0  . colchicine 0.6 MG tablet Take 1 tablet (0.6 mg total) by mouth daily. 30 tablet 1  . losartan (COZAAR) 50 MG tablet Take 50 mg by mouth daily.    Marland Kitchen losartan (COZAAR) 50 MG tablet Take by mouth.    Marland Kitchen atorvastatin (LIPITOR) 20 MG tablet Take 1 tablet (20 mg total) by mouth daily. (Patient not taking: Reported on 10/11/2020) 90 tablet 3   No facility-administered medications prior to visit.    No Known Allergies     Objective:    BP 130/88 (BP Location: Left Arm, Patient Position: Sitting, Cuff Size: Normal)   Pulse 88   Temp 97.7 F (36.5 C) (Temporal)   Ht '5\' 6"'  (1.676 m)   Wt 183 lb 9.6 oz (83.3 kg)   SpO2 99%   BMI 29.63 kg/m  Wt Readings from Last 3 Encounters:  10/11/20 183 lb 9.6 oz (83.3 kg)  11/02/19 185 lb 12.8 oz (84.3 kg)  02/24/19 169 lb 3.2 oz (76.7 kg)    Physical Exam Vitals and nursing note reviewed.  Constitutional:      Appearance: He is well-developed.  HENT:     Head: Normocephalic and atraumatic.  Cardiovascular:     Rate and Rhythm: Normal rate and regular rhythm.     Heart sounds: Normal heart sounds. No murmur heard.  No friction rub. No gallop.   Pulmonary:     Effort: Pulmonary effort is normal. No tachypnea or respiratory distress.     Breath sounds: Normal breath sounds. No decreased breath sounds, wheezing, rhonchi or rales.  Chest:     Chest wall: No tenderness.  Abdominal:     General: Bowel sounds are normal.     Palpations: Abdomen is soft.  Musculoskeletal:        General: Normal range of motion.     Cervical back: Normal range of motion.  Skin:    General: Skin is warm and dry.  Neurological:     Mental Status: He is alert and oriented to  person, place, and time.     Coordination: Coordination normal.  Psychiatric:        Behavior: Behavior normal. Behavior is cooperative.        Thought Content: Thought content normal.        Judgment: Judgment normal.          Patient has been counseled extensively about nutrition and exercise as well as the importance of adherence with medications and regular follow-up.  The patient was given clear instructions to go to ER or return to medical center if symptoms don't improve, worsen or new problems develop. The patient verbalized understanding.   Follow-up: Return in about 3 months (around 01/11/2021).   Gildardo Pounds, FNP-BC [facility] [place], [place]   10/17/2020,

## 2020-10-12 LAB — CMP14+EGFR
ALT: 14 IU/L (ref 0–44)
AST: 21 IU/L (ref 0–40)
Albumin/Globulin Ratio: 1.8 (ref 1.2–2.2)
Albumin: 4.9 g/dL (ref 4.0–5.0)
Alkaline Phosphatase: 117 IU/L (ref 44–121)
BUN/Creatinine Ratio: 13 (ref 9–20)
BUN: 20 mg/dL (ref 6–24)
Bilirubin Total: 0.2 mg/dL (ref 0.0–1.2)
CO2: 23 mmol/L (ref 20–29)
Calcium: 9.9 mg/dL (ref 8.7–10.2)
Chloride: 104 mmol/L (ref 96–106)
Creatinine, Ser: 1.57 mg/dL — ABNORMAL HIGH (ref 0.76–1.27)
GFR calc Af Amer: 59 mL/min/{1.73_m2} — ABNORMAL LOW (ref >59)
GFR calc non Af Amer: 51 mL/min/{1.73_m2} — ABNORMAL LOW (ref >59)
Globulin, Total: 2.8 g/dL (ref 1.5–4.5)
Glucose: 78 mg/dL (ref 65–99)
Potassium: 4.3 mmol/L (ref 3.5–5.2)
Sodium: 142 mmol/L (ref 134–144)
Total Protein: 7.7 g/dL (ref 6.0–8.5)

## 2020-10-12 LAB — LIPID PANEL
Chol/HDL Ratio: 5.3 ratio — ABNORMAL HIGH (ref 0.0–5.0)
Cholesterol, Total: 216 mg/dL — ABNORMAL HIGH (ref 100–199)
HDL: 41 mg/dL (ref >39)
LDL Chol Calc (NIH): 131 mg/dL — ABNORMAL HIGH (ref 0–99)
Triglycerides: 244 mg/dL — ABNORMAL HIGH (ref 0–149)
VLDL Cholesterol Cal: 44 mg/dL — ABNORMAL HIGH (ref 5–40)

## 2020-10-12 LAB — CBC
Hematocrit: 48.5 % (ref 37.5–51.0)
Hemoglobin: 16.8 g/dL (ref 13.0–17.7)
MCH: 31.2 pg (ref 26.6–33.0)
MCHC: 34.6 g/dL (ref 31.5–35.7)
MCV: 90 fL (ref 79–97)
Platelets: 270 10*3/uL (ref 150–450)
RBC: 5.38 x10E6/uL (ref 4.14–5.80)
RDW: 13.4 % (ref 11.6–15.4)
WBC: 8.1 10*3/uL (ref 3.4–10.8)

## 2020-10-12 LAB — HEPATITIS C ANTIBODY: Hep C Virus Ab: <0.1 s/co ratio (ref 0.0–0.9)

## 2020-10-12 LAB — T3, FREE: T3, Free: 3 pg/mL (ref 2.0–4.4)

## 2020-10-12 LAB — URIC ACID: Uric Acid: 4.9 mg/dL (ref 3.8–8.4)

## 2020-10-12 LAB — T4, FREE: Free T4: 1.31 ng/dL (ref 0.82–1.77)

## 2020-10-12 LAB — TSH: TSH: 0.359 u[IU]/mL — ABNORMAL LOW (ref 0.450–4.500)

## 2020-10-12 MED FILL — ALLOPURINOL 300 MG TAB: 300 | 30 days supply | Qty: 60 | Fill #0

## 2020-10-12 MED FILL — LOSARTAN POTASSIUM 50 MG TA: 50 | 30 days supply | Qty: 30 | Fill #0

## 2020-10-12 MED FILL — COLCHICINE 0.6 MG TABS: 0.6 | 30 days supply | Qty: 30 | Fill #0

## 2020-10-15 ENCOUNTER — Other Ambulatory Visit: Payer: Self-pay | Admitting: Nurse Practitioner

## 2020-10-15 DIAGNOSIS — E782 Mixed hyperlipidemia: Secondary | ICD-10-CM

## 2020-10-15 DIAGNOSIS — E0590 Thyrotoxicosis, unspecified without thyrotoxic crisis or storm: Secondary | ICD-10-CM

## 2020-10-15 MED ORDER — ATORVASTATIN CALCIUM 20 MG PO TABS: 20.0000 mg | ORAL_TABLET | Freq: Every day | ORAL | 3 refills | Status: DC

## 2020-10-16 MED FILL — ATORVASTATIN CALCIUM 20 MG: 20 | 30 days supply | Qty: 30 | Fill #0

## 2020-10-17 ENCOUNTER — Encounter: Payer: Self-pay | Admitting: Nurse Practitioner

## 2020-11-03 ENCOUNTER — Ambulatory Visit: Payer: Self-pay | Attending: Nurse Practitioner

## 2020-11-03 ENCOUNTER — Other Ambulatory Visit: Payer: Self-pay

## 2020-11-14 MED FILL — LOSARTAN POTASSIUM 50 MG TA: 50 | 30 days supply | Qty: 30 | Fill #1

## 2020-11-14 MED FILL — $COLCRYS 0.6 MG TABLET: 0.6 | 90 days supply | Qty: 90 | Fill #1

## 2020-11-14 MED FILL — ALLOPURINOL 300 MG TAB: 300 | 30 days supply | Qty: 60 | Fill #1

## 2020-11-23 ENCOUNTER — Telehealth: Payer: Self-pay | Admitting: Nurse Practitioner

## 2020-11-24 NOTE — Telephone Encounter (Signed)
mistake

## 2020-12-20 MED FILL — LOSARTAN POTASSIUM 50 MG TA: 50 | 30 days supply | Qty: 30 | Fill #2

## 2020-12-20 MED FILL — ALLOPURINOL 300 MG TAB: 300 | 30 days supply | Qty: 60 | Fill #2

## 2021-01-12 ENCOUNTER — Other Ambulatory Visit: Payer: Self-pay

## 2021-01-12 ENCOUNTER — Ambulatory Visit: Payer: Self-pay | Attending: Nurse Practitioner | Admitting: Nurse Practitioner

## 2021-01-12 ENCOUNTER — Encounter: Payer: Self-pay | Admitting: Nurse Practitioner

## 2021-01-12 ENCOUNTER — Other Ambulatory Visit: Payer: Self-pay | Admitting: Nurse Practitioner

## 2021-01-12 DIAGNOSIS — M1A9XX1 Chronic gout, unspecified, with tophus (tophi): Secondary | ICD-10-CM

## 2021-01-12 DIAGNOSIS — E785 Hyperlipidemia, unspecified: Secondary | ICD-10-CM

## 2021-01-12 DIAGNOSIS — I10 Essential (primary) hypertension: Secondary | ICD-10-CM

## 2021-01-12 DIAGNOSIS — N1832 Chronic kidney disease, stage 3b: Secondary | ICD-10-CM

## 2021-01-12 MED ORDER — COLCHICINE 0.6 MG PO TABS: 0.6000 mg | ORAL_TABLET | Freq: Every day | ORAL | 1 refills | Status: DC

## 2021-01-12 MED ORDER — LOSARTAN POTASSIUM 50 MG PO TABS: 50.0000 mg | ORAL_TABLET | Freq: Every day | ORAL | 0 refills | Status: DC

## 2021-01-12 MED ORDER — ALLOPURINOL 300 MG PO TABS: 600.0000 mg | ORAL_TABLET | Freq: Every day | ORAL | 0 refills | Status: DC

## 2021-01-12 NOTE — Progress Notes (Signed)
Virtual Visit via Telephone Note Due to national recommendations of social distancing due to COVID 19, telehealth visit is felt to be most appropriate for this patient at this time.  I discussed the limitations, risks, security and privacy concerns of performing an evaluation and management service by telephone and the availability of in person appointments. I also discussed with the patient that there may be a patient responsible charge related to this service. The patient expressed understanding and agreed to proceed.    I connected with Nathan Rubio on 01/12/21  at  10:30 AM EST  EDT by telephone and verified that I am speaking with the correct person using two identifiers.   Consent I discussed the limitations, risks, security and privacy concerns of performing an evaluation and management service by telephone and the availability of in person appointments. I also discussed with the patient that there may be a patient responsible charge related to this service. The patient expressed understanding and agreed to proceed.   Location of Patient: Private Residence    [facility] of Provider[facility]    Persons participating in Telemedicine visit: [name] FNP-BC [facility] [place] CMA Nathan Rubio    History of Present Illness: Telemedicine visit for: Follow Up  has a past medical history of Gout and Inflammatory polyarthritis (HCC).    GOUT History of tophaceous gout, unspecified inflammatory polyarthropathy, CKD stage 3. He has seen Rheumatology in the past however could not continue to afford the Out of pocket cost. He now has been approved for the [facility] financial assistance and will be referred back to rheumatology. He does not have a history of nephrolithiasis. Gout started >25 years ago. Currently taking 600 mg of allopurinol daily and 0.6mg  of colchicine.Symptoms controlled at this time.   Essential Hypertension Takes losartan 50 mg daily as  prescribed. Denies chest pain, shortness of breath, palpitations, lightheadedness, dizziness, headaches or BLE edema.  BP Readings from Last 3 Encounters:  10/11/20 130/88  11/02/19 (!) 144/103  02/24/19 (!) 143/103      Past Medical History:  Diagnosis Date  . Gout   . Inflammatory polyarthritis (HCC)     History reviewed. No pertinent surgical history.  Family History  Problem Relation Age of Onset  . Depression Neg Hx   . Diabetes Neg Hx     Social History   Socioeconomic History  . Marital status: Single    Spouse name: Not on file  . Number of children: Not on file  . Years of education: Not on file  . Highest education level: Not on file  Occupational History  . Not on file  Tobacco Use  . Smoking status: Never Smoker  . Smokeless tobacco: Never Used  Vaping Use  . Vaping Use: Never used  Substance and Sexual Activity  . Alcohol use: Not Currently  . Drug use: Never  . Sexual activity: Not on file  Other Topics Concern  . Not on file  Social History Narrative  . Not on file   Social Determinants of Health   Financial Resource Strain: Not on file  Food Insecurity: Not on file  Transportation Needs: Not on file  Physical Activity: Not on file  Stress: Not on file  Social Connections: Not on file     Observations/Objective: Awake, alert and oriented x 3   Review of Systems  Constitutional: Negative for fever, malaise/fatigue and weight loss.  HENT: Negative.  Negative for nosebleeds.   Eyes: Negative.  Negative  for blurred vision, double vision and photophobia.  Respiratory: Negative.  Negative for cough and shortness of breath.   Cardiovascular: Negative.  Negative for chest pain, palpitations and leg swelling.  Gastrointestinal: Negative.  Negative for heartburn, nausea and vomiting.  Musculoskeletal: Positive for joint pain. Negative for myalgias.  Neurological: Negative.  Negative for dizziness, focal weakness, seizures and headaches.   Psychiatric/Behavioral: Negative.  Negative for suicidal ideas.    Assessment and Plan: [name] was seen today for follow-up.  Diagnoses and all orders for this visit:  Chronic gout with tophus, unspecified cause, unspecified site -     allopurinol (ZYLOPRIM) 300 MG tablet; Take 2 tablets (600 mg total) by mouth daily. -     colchicine 0.6 MG tablet; Take 1 tablet (0.6 mg total) by mouth daily for 10 days. -     Ambulatory referral to Rheumatology  Essential hypertension -     losartan (COZAAR) 50 MG tablet; Take 1 tablet (50 mg total) by mouth daily. Continue all antihypertensives as prescribed.  Remember to bring in your blood pressure log with you for your follow up appointment.  DASH/Mediterranean Diets are healthier choices for HTN.    Stage 3b chronic kidney disease (HCC) -     losartan (COZAAR) 50 MG tablet; Take 1 tablet (50 mg total) by mouth daily.     Follow Up Instructions Return in about 3 months (around 04/12/2021).     I discussed the assessment and treatment plan with the patient. The patient was provided an opportunity to ask questions and all were answered. The patient agreed with the plan and demonstrated an understanding of the instructions.   The patient was advised to call back or seek an in-person evaluation if the symptoms worsen or if the condition fails to improve as anticipated.  I provided 13 minutes of non-face-to-face time during this encounter including median intraservice time, reviewing previous notes, labs, imaging, medications and explaining diagnosis and management.  Claiborne Rigg, FNP-BC

## 2021-01-15 ENCOUNTER — Ambulatory Visit: Payer: Self-pay | Attending: Nurse Practitioner

## 2021-01-15 ENCOUNTER — Other Ambulatory Visit: Payer: Self-pay

## 2021-01-15 DIAGNOSIS — E785 Hyperlipidemia, unspecified: Secondary | ICD-10-CM

## 2021-01-15 DIAGNOSIS — M1A9XX1 Chronic gout, unspecified, with tophus (tophi): Secondary | ICD-10-CM

## 2021-01-15 DIAGNOSIS — N1832 Chronic kidney disease, stage 3b: Secondary | ICD-10-CM

## 2021-01-15 MED FILL — ALLOPURINOL 300 MG TAB: 300 | 30 days supply | Qty: 60 | Fill #0

## 2021-01-15 MED FILL — LOSARTAN POTASSIUM 50 MG TA: 50 | 30 days supply | Qty: 30 | Fill #3

## 2021-01-16 LAB — CMP14+EGFR
ALT: 14 IU/L (ref 0–44)
AST: 24 IU/L (ref 0–40)
Albumin/Globulin Ratio: 1.6 (ref 1.2–2.2)
Albumin: 4.8 g/dL (ref 4.0–5.0)
Alkaline Phosphatase: 120 IU/L (ref 44–121)
BUN/Creatinine Ratio: 12 (ref 9–20)
BUN: 20 mg/dL (ref 6–24)
Bilirubin Total: 0.3 mg/dL (ref 0.0–1.2)
CO2: 21 mmol/L (ref 20–29)
Calcium: 9.8 mg/dL (ref 8.7–10.2)
Chloride: 103 mmol/L (ref 96–106)
Creatinine, Ser: 1.67 mg/dL — ABNORMAL HIGH (ref 0.76–1.27)
GFR calc Af Amer: 55 mL/min/{1.73_m2} — ABNORMAL LOW (ref >59)
GFR calc non Af Amer: 47 mL/min/{1.73_m2} — ABNORMAL LOW (ref >59)
Globulin, Total: 3 g/dL (ref 1.5–4.5)
Glucose: 76 mg/dL (ref 65–99)
Potassium: 4.2 mmol/L (ref 3.5–5.2)
Sodium: 141 mmol/L (ref 134–144)
Total Protein: 7.8 g/dL (ref 6.0–8.5)

## 2021-01-16 LAB — CBC
Hematocrit: 49.2 % (ref 37.5–51.0)
Hemoglobin: 17 g/dL (ref 13.0–17.7)
MCH: 31 pg (ref 26.6–33.0)
MCHC: 34.6 g/dL (ref 31.5–35.7)
MCV: 90 fL (ref 79–97)
Platelets: 281 10*3/uL (ref 150–450)
RBC: 5.48 x10E6/uL (ref 4.14–5.80)
RDW: 13 % (ref 11.6–15.4)
WBC: 8 10*3/uL (ref 3.4–10.8)

## 2021-01-16 LAB — LIPID PANEL
Chol/HDL Ratio: 5.1 ratio — ABNORMAL HIGH (ref 0.0–5.0)
Cholesterol, Total: 210 mg/dL — ABNORMAL HIGH (ref 100–199)
HDL: 41 mg/dL (ref >39)
LDL Chol Calc (NIH): 119 mg/dL — ABNORMAL HIGH (ref 0–99)
Triglycerides: 287 mg/dL — ABNORMAL HIGH (ref 0–149)
VLDL Cholesterol Cal: 50 mg/dL — ABNORMAL HIGH (ref 5–40)

## 2021-02-15 MED FILL — $COLCRYS 0.6 MG TABLET: 0.6 | 30 days supply | Qty: 30 | Fill #1

## 2021-02-15 MED FILL — LOSARTAN POTASSIUM 50 MG TA: 50 | 30 days supply | Qty: 30 | Fill #4

## 2021-02-15 MED FILL — ALLOPURINOL 300 MG TAB: 300 | 30 days supply | Qty: 60 | Fill #1

## 2021-02-15 NOTE — Progress Notes (Signed)
Office Visit Note  Patient: Nathan Rubio             Date of Birth: 05/26/1971           MRN: [id number]             PCP: Gildardo Pounds, NP Referring: Gildardo Pounds, NP Visit Date: 02/16/2021 Occupation: [employer] [employer]  Subjective:  New Patient (Initial Visit) (Gout improving with medication, knot on right elbow)   History of Present Illness: Nathan Rubio is a 50 y.o. male with HTN, HLD, CKD stage 3 here for evaluation and management of chronic tophaceous gout.  He has gout flares since at least 25 years ago with varying frequency but somewhere between 3-6 times per year most of the time.  More recently he is also had persistent pain and stiffness even between the specific flares.  He was treated with allopurinol in the past but did not stay on this medicine consistently due to lack of medical follow-up. He used to take NSAIDs OTC heavily for symptoms but stopped after being found to have CKD. 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 He was previously seen with Dr. Annabelle Harman at [facility] rheumatology but not following up due to costs. 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 He has been back on allopurinol not taking 600 mg daily for few months most recent uric acid was at goal in November.  He is taking colchicine as needed for flares which is usually effective within 2 to 3 days.  100% CFA  Labs reviewed 12/2020 eGFR 47 CBC wnl  09/2020 TSH 0.359 fT4 1.31 fT3 3.0  09/2020 Uric acid 4.9   Activities of Daily Living:  Patient reports morning stiffness for 0 none.   Patient Denies nocturnal pain.  Difficulty dressing/grooming: Denies Difficulty climbing stairs: Denies Difficulty getting out of chair: Denies Difficulty using hands for taps, buttons, cutlery, and/or writing: Denies  Review of Systems  Constitutional: Negative for fatigue.  HENT:  Negative for mouth dryness.   Eyes: Negative for dryness.  Respiratory: Negative for shortness of breath.   Cardiovascular: Negative for swelling in legs/feet.  Gastrointestinal: Negative for constipation.  Endocrine: Negative for excessive thirst.  Genitourinary: Negative for difficulty urinating.  Musculoskeletal: Negative for arthralgias and joint pain.  Skin: Negative for rash.  Allergic/Immunologic: Negative for susceptible to infections.  Neurological: Negative for numbness.  Hematological: Negative for bruising/bleeding tendency.  Psychiatric/Behavioral: Negative for sleep disturbance.    PMFS History:  Patient Active Problem List   Diagnosis Date Noted  . CKD (chronic kidney disease) stage 3, GFR 30-59 ml/min (HCC) 03/20/2020  . Essential hypertension 03/20/2020  . Idiopathic chronic gout of multiple sites with tophus 03/20/2020    Past Medical History:  Diagnosis Date  . Gout   . Inflammatory polyarthritis ([place])     Family History  Problem Relation Age of Onset  . Hypertension Sister   . Depression Neg Hx   . Diabetes Neg Hx    History reviewed. No pertinent surgical history. Social History   Social History Narrative  . Not on file   Immunization History  Administered Date(s) Administered  . PFIZER(Purple Top)SARS-COV-2 Vaccination 04/14/2020, 05/14/2020, 01/05/2021     Objective: Vital Signs: BP 112/73 (BP Location: Left Arm, Patient Position: Sitting, Cuff Size: Normal)   Pulse 83   Resp 14   Ht '5\' 8"'  (1.727 m)  Wt 182 lb (82.6 kg)   BMI 27.67 kg/m    Physical Exam HENT:     Right Ear: External ear normal.     Left Ear: External ear normal.     Mouth/Throat:     Mouth: Mucous membranes are moist.     Pharynx: Oropharynx is clear.  Eyes:     Conjunctiva/sclera: Conjunctivae normal.  Cardiovascular:     Rate and Rhythm: Normal rate and regular rhythm.  Pulmonary:     Effort: Pulmonary effort is normal.     Breath sounds: Normal breath  sounds.  Skin:    General: Skin is warm and dry.     Findings: No rash.  Neurological:     General: No focal deficit present.     Mental Status: He is alert.  Psychiatric:        Mood and Affect: Mood normal.    Musculoskeletal Exam:  Neck full ROM no tenderness Shoulders full ROM no tenderness or swelling Elbows full ROM no tenderness large tophus on right, multiple tophi extending to forearm extensor surface on left Wrists restricted flexion and extension ROM b/l with some crepitus, no current swelling Fingers with right hand ulnar deviation, 2-3 MTP enlargement, 5th DIP fixed flexion, left hand swan neck deformities of 2-4th digits, ROM intact Knees full ROM no tenderness or swelling, patellofemoral crepitus present Ankles ROM slightly restricted, right lateral tophus present, left side normal MTPs 1st MTP reduced dorsiflexion ROM, no swelling no tophi   Investigation: No additional findings.  Imaging: No results found.  Recent Labs: Lab Results  Component Value Date   WBC 8.0 01/15/2021   HGB 17.0 01/15/2021   PLT 281 01/15/2021   NA 141 01/15/2021   K 4.2 01/15/2021   CL 103 01/15/2021   CO2 21 01/15/2021   GLUCOSE 76 01/15/2021   BUN 20 01/15/2021   CREATININE 1.67 (H) 01/15/2021   BILITOT 0.3 01/15/2021   ALKPHOS 120 01/15/2021   AST 24 01/15/2021   ALT 14 01/15/2021   PROT 7.8 01/15/2021   ALBUMIN 4.8 01/15/2021   CALCIUM 9.8 01/15/2021   GFRAA 55 (L) 01/15/2021    Speciality Comments: No specialty comments available.  Procedures:  No procedures performed Allergies: Patient has no known allergies.   Assessment / Plan:     Visit Diagnoses: Idiopathic chronic gout of multiple sites with tophus  Idiopathic chronic gout uncontrolled for decades now presenting as a chronic polyarticular inflammatory arthritis.  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  No history of nephrolithiasis.  His gout predates his  renal disease.  We will check uric acid level to determine if current allopurinol 633m dose is appropriate or needs adjustment.  I discussed recommendation for once daily prophylactic colchicine for the next month assuming renal function looks stable.  Also provided information to review a low purine diet modification. [facility] provided for there or with hospital lab.  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  So far no rapid progression over the past year.  He is tolerating losartan which I agree is a very good medication choice for his medical issues.  Orders: No orders of the defined types were placed in this encounter.  No orders of the defined types were placed in this encounter.  Follow-Up Instructions: Return in about 6 months (around 08/16/2021) for  Gout f/u.   Collier Salina, MD  Note - This record has been created using [employer].  Chart creation errors have been sought, but may not always  have been located. Such creation errors do not reflect on  the standard of medical care.

## 2021-02-16 ENCOUNTER — Ambulatory Visit (INDEPENDENT_AMBULATORY_CARE_PROVIDER_SITE_OTHER): Payer: Self-pay | Admitting: Internal Medicine

## 2021-02-16 ENCOUNTER — Encounter: Payer: Self-pay | Admitting: Internal Medicine

## 2021-02-16 ENCOUNTER — Ambulatory Visit: Payer: Self-pay | Attending: Nurse Practitioner

## 2021-02-16 ENCOUNTER — Other Ambulatory Visit: Payer: Self-pay | Admitting: Nurse Practitioner

## 2021-02-16 ENCOUNTER — Other Ambulatory Visit: Payer: Self-pay

## 2021-02-16 VITALS — BP 112/73 | HR 83 | Resp 14 | Ht 68.0 in | Wt 182.0 lb

## 2021-02-16 DIAGNOSIS — I10 Essential (primary) hypertension: Secondary | ICD-10-CM

## 2021-02-16 DIAGNOSIS — N1831 Chronic kidney disease, stage 3a: Secondary | ICD-10-CM

## 2021-02-16 DIAGNOSIS — R7989 Other specified abnormal findings of blood chemistry: Secondary | ICD-10-CM

## 2021-02-16 DIAGNOSIS — M1A09X1 Idiopathic chronic gout, multiple sites, with tophus (tophi): Secondary | ICD-10-CM

## 2021-02-16 DIAGNOSIS — E0590 Thyrotoxicosis, unspecified without thyrotoxic crisis or storm: Secondary | ICD-10-CM

## 2021-02-16 DIAGNOSIS — M1A9XX1 Chronic gout, unspecified, with tophus (tophi): Secondary | ICD-10-CM

## 2021-02-16 DIAGNOSIS — E785 Hyperlipidemia, unspecified: Secondary | ICD-10-CM

## 2021-02-16 NOTE — Patient Instructions (Signed)
Low-Purine Eating Plan A low-purine eating plan involves making food choices to limit your intake of purine. Purine is a kind of uric acid. Too much uric acid in your blood can cause certain conditions, such as gout and kidney stones. Eating a low-purine diet can help control these conditions. What are tips for following this plan? Reading food labels  Avoid foods with saturated or Trans fat.  Check the ingredient list of grains-based foods, such as bread and cereal, to make sure that they contain whole grains.  Check the ingredient list of sauces or soups to make sure they do not contain meat or fish.  When choosing soft drinks, check the ingredient list to make sure they do not contain high-fructose corn syrup. Shopping  Buy plenty of fresh fruits and vegetables.  Avoid buying canned or fresh fish.  Buy dairy products labeled as low-fat or nonfat.  Avoid buying premade or processed foods. These foods are often high in fat, salt (sodium), and added sugar.   Cooking  Use olive oil instead of butter when cooking. Oils like olive oil, canola oil, and sunflower oil contain healthy fats. Meal planning  Learn which foods do or do not affect you. If you find out that a food tends to cause your gout symptoms to flare up, avoid eating that food. You can enjoy foods that do not cause problems. If you have any questions about a food item, talk with your dietitian or health care provider.  Limit foods high in fat, especially saturated fat. Fat makes it harder for your body to get rid of uric acid.  Choose foods that are lower in fat and are lean sources of protein. General guidelines  Limit alcohol intake to no more than 1 drink a day for nonpregnant women and 2 drinks a day for men. One drink equals 12 oz of beer, 5 oz of wine, or 1 oz of hard liquor. Alcohol can affect the way your body gets rid of uric acid.  Drink plenty of water to keep your urine clear or pale yellow. Fluids can help  remove uric acid from your body.  If directed by your health care provider, take a vitamin C supplement.  Work with your health care provider and dietitian to develop a plan to achieve or maintain a healthy weight. Losing weight can help reduce uric acid in your blood. What foods are recommended? The items listed may not be a complete list. Talk with your dietitian about what dietary choices are best for you. Foods low in purines Foods low in purines do not need to be limited. These include:  All fruits.  All low-purine vegetables, pickles, and olives.  Breads, pasta, Ules Marsala, cornbread, and popcorn. Cake and other baked goods.  All dairy foods.  Eggs, nuts, and nut butters.  Spices and condiments, such as salt, herbs, and vinegar.  Plant oils, butter, and margarine.  Water, sugar-free soft drinks, tea, coffee, and cocoa.  Vegetable-based soups, broths, sauces, and gravies. Foods moderate in purines Foods moderate in purines should be limited to the amounts listed.   cup of asparagus, cauliflower, spinach, mushrooms, or green peas, each day.  2/3 cup uncooked oatmeal, each day.   cup dry wheat bran or wheat germ, each day.  2-3 ounces of meat or poultry, each day.  4-6 ounces of shellfish, such as crab, lobster, oysters, or shrimp, each day.  1 cup cooked beans, peas, or lentils, each day.  Soup, broths, or bouillon made from meat  or fish. Limit these foods as much as possible. What foods are not recommended? The items listed may not be a complete list. Talk with your dietitian about what dietary choices are best for you. Limit your intake of foods high in purines, including:  Beer and other alcohol.  Meat-based gravy or sauce.  Canned or fresh fish, such as: ? Anchovies, sardines, herring, and tuna. ? Mussels and scallops. ? Codfish, trout, and haddock.  Bacon.  Organ meats, such as: ? Liver or kidney. ? Tripe. ? Sweetbreads (thymus gland or  pancreas).  Wild game or goose.  Yeast or yeast extract supplements.  Drinks sweetened with high-fructose corn syrup. Summary  Eating a low-purine diet can help control conditions caused by too much uric acid in the body, such as gout or kidney stones.  Choose low-purine foods, limit alcohol, and limit foods high in fat.  You will learn over time which foods do or do not affect you. If you find out that a food tends to cause your gout symptoms to flare up, avoid eating that food. This information is not intended to replace advice given to you by your health care provider. Make sure you discuss any questions you have with your health care provider. Document Revised: 03/23/2020 Document Reviewed: 03/23/2020 Elsevier Patient Education  2021 Else[employer] Inc.  Gout  Gout is a condition that causes painful swelling of the joints. Gout is a type of inflammation of the joints (arthritis). This condition is caused by having too much uric acid in the body. Uric acid is a chemical that forms when the body breaks down substances called purines. Purines are important for building body proteins. When the body has too much uric acid, sharp crystals can form and build up inside the joints. This causes pain and swelling. Gout attacks can happen quickly and may be very painful (acute gout). Over time, the attacks can affect more joints and become more frequent (chronic gout). Gout can also cause uric acid to build up under the skin and inside the kidneys. What are the causes? This condition is caused by too much uric acid in your blood. This can happen because:  Your kidneys do not remove enough uric acid from your blood. This is the most common cause.  Your body makes too much uric acid. This can happen with some cancers and cancer treatments. It can also occur if your body is breaking down too many red blood cells (hemolytic anemia).  You eat too many foods that are high in purines. These foods include  organ meats and some seafood. Alcohol, especially beer, is also high in purines. A gout attack may be triggered by trauma or stress. What increases the risk? You are more likely to develop this condition if you:  Have a family history of gout.  Are male and middle-aged.  Are male and have gone through menopause.  Are obese.  Frequently drink alcohol, especially beer.  Are dehydrated.  Lose weight too quickly.  Have an organ transplant.  Have lead poisoning.  Take certain medicines, including aspirin, cyclosporine, diuretics, levodopa, and niacin.  Have kidney disease.  Have a skin condition called psoriasis. What are the signs or symptoms? An attack of acute gout happens quickly. It usually occurs in just one joint. The most common place is the big toe. Attacks often start at night. Other joints that may be affected include joints of the feet, ankle, knee, fingers, wrist, or elbow. Symptoms of this condition may include:    Severe pain.  Warmth.  Swelling.  Stiffness.  Tenderness. The affected joint may be very painful to touch.  Shiny, red, or purple skin.  Chills and fever. Chronic gout may cause symptoms more frequently. More joints may be involved. You may also have white or yellow lumps (tophi) on your hands or feet or in other areas near your joints.   How is this diagnosed? This condition is diagnosed based on your symptoms, medical history, and physical exam. You may have tests, such as:  Blood tests to measure uric acid levels.  Removal of joint fluid with a thin needle (aspiration) to look for uric acid crystals.  X-rays to look for joint damage. How is this treated? Treatment for this condition has two phases: treating an acute attack and preventing future attacks. Acute gout treatment may include medicines to reduce pain and swelling, including:  NSAIDs.  Steroids. These are strong anti-inflammatory medicines that can be taken by mouth (orally) or  injected into a joint.  Colchicine. This medicine relieves pain and swelling when it is taken soon after an attack. It can be given by mouth or through an IV. Preventive treatment may include:  Daily use of smaller doses of NSAIDs or colchicine.  Use of a medicine that reduces uric acid levels in your blood.  Changes to your diet. You may need to see a dietitian about what to eat and drink to prevent gout. Follow these instructions at home: During a gout attack  If directed, put ice on the affected area: ? Put ice in a plastic bag. ? Place a towel between your skin and the bag. ? Leave the ice on for 20 minutes, 2-3 times a day.  Raise (elevate) the affected joint above the level of your heart as often as possible.  Rest the joint as much as possible. If the affected joint is in your leg, you may be given crutches to use.  Follow instructions from your health care provider about eating or drinking restrictions.   Avoiding future gout attacks  Follow a low-purine diet as told by your dietitian or health care provider. Avoid foods and drinks that are high in purines, including liver, kidney, anchovies, asparagus, herring, mushrooms, mussels, and beer.  Maintain a healthy weight or lose weight if you are overweight. If you want to lose weight, talk with your health care provider. It is important that you do not lose weight too quickly.  Start or maintain an exercise program as told by your health care provider. Eating and drinking  Drink enough fluids to keep your urine pale yellow.  If you drink alcohol: ? Limit how much you use to:  0-1 drink a day for women.  0-2 drinks a day for men. ? Be aware of how much alcohol is in your drink. In the [place] one drink equals one 12 oz bottle of beer (355 mL) one 5 oz glass of wine (148 mL), or one 1 oz glass of hard liquor (44 mL). General instructions  Take over-the-counter and prescription medicines only as told by your health care  provider.  Do not drive or use heavy machinery while taking prescription pain medicine.  Return to your normal activities as told by your health care provider. Ask your health care provider what activities are safe for you.  Keep all follow-up visits as told by your health care provider. This is important. Contact a health care provider if you have:  Another gout attack.  Continuing symptoms of a   gout attack after 10 days of treatment.  Side effects from your medicines.  Chills or a fever.  Burning pain when you urinate.  Pain in your lower back or belly. Get help right away if you:  Have severe or uncontrolled pain.  Cannot urinate. Summary  Gout is painful swelling of the joints caused by inflammation.  The most common site of pain is the big toe, but it can affect other joints in the body.  Medicines and dietary changes can help to prevent and treat gout attacks.

## 2021-02-17 LAB — BASIC METABOLIC PANEL
BUN/Creatinine Ratio: 13 (ref 9–20)
BUN: 22 mg/dL (ref 6–24)
CO2: 17 mmol/L — ABNORMAL LOW (ref 20–29)
Calcium: 10.3 mg/dL — ABNORMAL HIGH (ref 8.7–10.2)
Chloride: 102 mmol/L (ref 96–106)
Creatinine, Ser: 1.68 mg/dL — ABNORMAL HIGH (ref 0.76–1.27)
GFR calc Af Amer: 54 mL/min/{1.73_m2} — ABNORMAL LOW (ref >59)
GFR calc non Af Amer: 47 mL/min/{1.73_m2} — ABNORMAL LOW (ref >59)
Glucose: 90 mg/dL (ref 65–99)
Potassium: 4.7 mmol/L (ref 3.5–5.2)
Sodium: 140 mmol/L (ref 134–144)

## 2021-02-17 LAB — LIPID PANEL
Chol/HDL Ratio: 4.6 ratio (ref 0.0–5.0)
Cholesterol, Total: 205 mg/dL — ABNORMAL HIGH (ref 100–199)
HDL: 45 mg/dL (ref >39)
LDL Chol Calc (NIH): 135 mg/dL — ABNORMAL HIGH (ref 0–99)
Triglycerides: 138 mg/dL (ref 0–149)
VLDL Cholesterol Cal: 25 mg/dL (ref 5–40)

## 2021-02-17 LAB — THYROID PANEL WITH TSH
Free Thyroxine Index: 1.5 (ref 1.2–4.9)
T3 Uptake Ratio: 32 % (ref 24–39)
T4, Total: 4.8 ug/dL (ref 4.5–12.0)
TSH: 0.274 u[IU]/mL — ABNORMAL LOW (ref 0.450–4.500)

## 2021-02-17 LAB — URIC ACID: Uric Acid: 4.6 mg/dL (ref 3.8–8.4)

## 2021-02-18 ENCOUNTER — Other Ambulatory Visit: Payer: Self-pay | Admitting: Nurse Practitioner

## 2021-02-18 DIAGNOSIS — E0590 Thyrotoxicosis, unspecified without thyrotoxic crisis or storm: Secondary | ICD-10-CM

## 2021-03-19 MED FILL — ALLOPURINOL 300 MG TAB: 300 | 30 days supply | Qty: 60 | Fill #2

## 2021-03-19 MED FILL — LOSARTAN POTASSIUM 50 MG TA: 50 | 30 days supply | Qty: 30 | Fill #5

## 2021-03-24 ENCOUNTER — Other Ambulatory Visit: Payer: Self-pay

## 2021-04-13 ENCOUNTER — Encounter: Payer: Self-pay | Admitting: Internal Medicine

## 2021-04-13 ENCOUNTER — Ambulatory Visit (INDEPENDENT_AMBULATORY_CARE_PROVIDER_SITE_OTHER): Payer: Self-pay | Admitting: Internal Medicine

## 2021-04-13 ENCOUNTER — Other Ambulatory Visit: Payer: Self-pay

## 2021-04-13 VITALS — BP 130/82 | HR 104 | Ht 68.0 in | Wt 186.5 lb

## 2021-04-13 DIAGNOSIS — E0590 Thyrotoxicosis, unspecified without thyrotoxic crisis or storm: Secondary | ICD-10-CM

## 2021-04-13 LAB — TSH: TSH: 0.42 u[IU]/mL (ref 0.35–4.50)

## 2021-04-13 LAB — T3, FREE: T3, Free: 3.6 pg/mL (ref 2.3–4.2)

## 2021-04-13 LAB — T4, FREE: Free T4: 0.88 ng/dL (ref 0.60–1.60)

## 2021-04-13 NOTE — Progress Notes (Signed)
Name: Nathan Rubio  MRN/ DOB: [id number], [birth year]    Age/ Sex: 50 y.o., male    PCP: Claiborne Rigg, NP   Reason for Endocrinology Evaluation: Subclinical hyperthyroidism     Date of Initial Endocrinology Evaluation: 04/13/2021     HPI: Nathan Rubio is a 50 y.o. male asian- [place] with a past medical history of HTN, gout  and CKD . The patient presented for initial endocrinology clinic visit on 04/13/2021 for consultative assistance with his subclinical hyperthyroidism.   He was noted with low TSH since 02/2019 with a nadir of 0.221 uIU/mL .   He has been feeling sluggish , but denies weight loss , its hard for him to lose weight .  Denies diarrhea , palpitations or tremors Denies local neck symptoms  Denies prior dx of bone fractures or cardiac arrhythmia  No Biotin intake   No family history of thyroid disease     HISTORY:  Past Medical History:  Past Medical History:  Diagnosis Date  . Gout   . Inflammatory polyarthritis (HCC)     Past Surgical History: No past surgical history on file.   Social History:  reports that he has never smoked. He has never used smokeless tobacco. He reports previous alcohol use. He reports that he does not use drugs.  Family History: family history includes Hypertension in his sister.   HOME MEDICATIONS: Allergies as of 04/13/2021   No Known Allergies     Medication List       Accurate as of April 13, 2021  8:42 AM. If you have any questions, ask your nurse or doctor.        allopurinol 300 MG tablet Commonly known as: ZYLOPRIM TAKE 2 TABLETS (600 MG TOTAL) BY MOUTH DAILY.   colchicine 0.6 MG tablet TAKE 1 TABLET (0.6 MG TOTAL) BY MOUTH DAILY FOR 10 DAYS.   losartan 50 MG tablet Commonly known as: COZAAR TAKE 1 TABLET (50 MG TOTAL) BY MOUTH DAILY.         REVIEW OF SYSTEMS: A comprehensive ROS was conducted with the patient and is negative except as per HPI     OBJECTIVE:  VS: BP 130/82    Pulse (!) 104   Ht 5\' 8"  (1.727 m)   Wt 186 lb 8 oz (84.6 kg)   SpO2 98%   BMI 28.36 kg/m    Wt Readings from Last 3 Encounters:  04/13/21 186 lb 8 oz (84.6 kg)  02/16/21 182 lb (82.6 kg)  10/11/20 183 lb 9.6 oz (83.3 kg)     EXAM: General: Pt appears well and is in NAD  Eyes: External eye exam normal without stare, lid lag or exophthalmos.  EOM intact.  Neck: General: Supple without adenopathy. Thyroid: Thyroid size normal.  No goiter or nodules appreciated. No thyroid bruit.  Lungs: Clear with good BS bilat with no rales, rhonchi, or wheezes  Heart: Auscultation: RRR.  Abdomen: Normoactive bowel sounds, soft, nontender, without masses or organomegaly palpable  Extremities:  BL LE: No pretibial edema normal ROM and strength.  Skin: Hair: Texture and amount normal with gender appropriate distribution Skin Inspection: No rashes Skin Palpation: Skin temperature, texture, and thickness normal to palpation  Mental Status: Judgment, insight: Intact Orientation: Oriented to time, place, and person Mood and affect: No depression, anxiety, or agitation     DATA REVIEWED: Results for Nathan Rubio, Nathan Rubio (MRN Phill Mutter) as of 04/13/2021 14:38  Ref. Range 04/13/2021 09:03  TSH Latest  Ref Range: 0.35 - 4.50 uIU/mL 0.42  Triiodothyronine,Free,Serum Latest Ref Range: 2.3 - 4.2 pg/mL 3.6  T4,Free(Direct) Latest Ref Range: 0.60 - 1.60 ng/dL 1.91    Results for Nathan Rubio, Nathan Rubio (MRN [id number]) as of 04/13/2021 07:26  Ref. Range 01/15/2021 15:35  WBC Latest Ref Range: 3.4 - 10.8 x10E3/uL 8.0  RBC Latest Ref Range: 4.14 - 5.80 x10E6/uL 5.48  Hemoglobin Latest Ref Range: 13.0 - 17.7 g/dL 30.8  HCT Latest Ref Range: 37.5 - 51.0 % 49.2  MCV Latest Ref Range: 79 - 97 fL 90  MCH Latest Ref Range: 26.6 - 33.0 pg 31.0  MCHC Latest Ref Range: 31.5 - 35.7 g/dL 65.7  RDW Latest Ref Range: 11.6 - 15.4 % 13.0  Platelets Latest Ref Range: 150 - 450 x10E3/uL 281   Results for Nathan Rubio, Nathan Rubio (MRN  [id number]) as of 04/13/2021 07:26  Ref. Range 02/16/2021 08:54  Sodium Latest Ref Range: 134 - 144 mmol/L 140  Potassium Latest Ref Range: 3.5 - 5.2 mmol/L 4.7  Chloride Latest Ref Range: 96 - 106 mmol/L 102  CO2 Latest Ref Range: 20 - 29 mmol/L 17 (L)  Glucose Latest Ref Range: 65 - 99 mg/dL 90  BUN Latest Ref Range: 6 - 24 mg/dL 22  Creatinine Latest Ref Range: 0.76 - 1.27 mg/dL 8.41 (H)  Calcium Latest Ref Range: 8.7 - 10.2 mg/dL 32.4 (H)  BUN/Creatinine Ratio Latest Ref Range: 9 - 20  13  Uric Acid Latest Ref Range: 3.8 - 8.4 mg/dL 4.6  GFR, Est Non African American Latest Ref Range: >59 mL/min/1.73 47 (L)  GFR, Est African American Latest Ref Range: >59 mL/min/1.73 54 (L)   ASSESSMENT/PLAN/RECOMMENDATIONS:   1. Subclinical Hyperthyroidism:  - Pt is clinically euthyroid  - He has no local neck symptoms  - We discussed D/D of graves' disease, vs autonomous thyroid nodule vs a normal variant as some populations have a TSH of < 0.4 uIU/mL due to a shift in the distribution of TSH values vs essay interference.  - At this time he is at low risk for developing cardiac arrhyhtmia or osteoporosis.  - Will proceed with TSi check and thyroid ultrasound  - Repeat TFT's are normal    F/U in 6 months   Signed electronically by: Lyndle Herrlich, MD  [facility] Endocrinology  [facility] [address] [place], [place] [address] Phone: [phone] FAX: [phone]   CC: Claiborne Rigg, NP [address] [place] [address] Phone: [phone] Fax: [phone]   Return to Endocrinology clinic as below: Future Appointments  Date Time Provider Department Center  04/20/2021  4:10 PM Claiborne Rigg, NP [id number] None  08/17/2021 11:00 AM Dimple Casey, Jamesetta Orleans, MD [facility] None

## 2021-04-17 LAB — THYROID STIMULATING IMMUNOGLOBULIN: TSI: <89 % baseline (ref <140)

## 2021-04-18 ENCOUNTER — Other Ambulatory Visit: Payer: Self-pay

## 2021-04-18 ENCOUNTER — Ambulatory Visit (HOSPITAL_COMMUNITY)
Admission: RE | Admit: 2021-04-18 | Discharge: 2021-04-18 | Disposition: A | Discharge status: Home / Self Care | Payer: Self-pay | Source: Ambulatory Visit | Attending: Internal Medicine | Admitting: Internal Medicine

## 2021-04-18 ENCOUNTER — Other Ambulatory Visit: Payer: Self-pay | Admitting: Nurse Practitioner

## 2021-04-18 DIAGNOSIS — E0590 Thyrotoxicosis, unspecified without thyrotoxic crisis or storm: Secondary | ICD-10-CM | POA: Insufficient documentation

## 2021-04-18 DIAGNOSIS — M1A9XX1 Chronic gout, unspecified, with tophus (tophi): Secondary | ICD-10-CM

## 2021-04-18 MED ORDER — ALLOPURINOL 300 MG PO TABS
ORAL_TABLET | ORAL | 0 refills | Status: DC
  Filled 2021-04-18: qty 60, 30d supply, fill #0
  Filled 2021-05-21: qty 60, 30d supply, fill #1
  Filled 2021-06-18: qty 60, 30d supply, fill #2

## 2021-04-18 MED FILL — Losartan Potassium Tab 50 MG: ORAL | 30 days supply | Qty: 30 | Fill #0 | Status: AC

## 2021-04-19 ENCOUNTER — Telehealth: Payer: Self-pay | Admitting: Internal Medicine

## 2021-04-19 NOTE — Telephone Encounter (Signed)
Discussed ultrasound results with the pt on 04/19/2021 at 1300     CLINICAL DATA:  Subclinical hyperthyroidism  EXAM: THYROID ULTRASOUND  TECHNIQUE: Ultrasound examination of the thyroid gland and adjacent soft tissues was performed.  COMPARISON:  None.  FINDINGS: Parenchymal Echotexture: Normal  Isthmus: 0.2 cm  Right lobe: 5.5 x 1.5 x 1.9 cm  Left lobe: 5.4 x 1.7 x 1.7 cm  _________________________________________________________  Estimated total number of nodules >/= 1 cm: 0  Number of spongiform nodules >/=  2 cm not described below (TR1): 0  Number of mixed cystic and solid nodules >/= 1.5 cm not described below (TR2): 0  _________________________________________________________  Subcentimeter mixed solid cystic nodule in the inferior right thyroid lobe does not meet criteria for FNA or surveillance.  Subcentimeter mixed solid cystic nodule in the inferior left thyroid lobe does not meet criteria for FNA or surveillance.  Incidental note made of slow flow in the left internal jugular vein.  IMPRESSION: No suspicious thyroid nodules.    Reassurance provided about sub-centimeter nodule  Discussed low slow in the left internal jugular vein, he has no dizziness, offered a referral to vascular surgeon as I am not sure how clinically significant that would be , he is reluctant at this time, but will notify me if he starts experiencing dizziness or any abnormal symptoms     Abby Raelyn Mora, MD  [facility]  [facility] [address] [place], [place] [address] Phone: [phone] FAX: [phone]

## 2021-04-20 ENCOUNTER — Other Ambulatory Visit: Payer: Self-pay

## 2021-04-20 ENCOUNTER — Ambulatory Visit: Payer: Self-pay | Attending: Nurse Practitioner | Admitting: Nurse Practitioner

## 2021-04-20 ENCOUNTER — Encounter: Payer: Self-pay | Admitting: Nurse Practitioner

## 2021-04-20 DIAGNOSIS — N1832 Chronic kidney disease, stage 3b: Secondary | ICD-10-CM

## 2021-04-20 DIAGNOSIS — I10 Essential (primary) hypertension: Secondary | ICD-10-CM

## 2021-04-20 DIAGNOSIS — M1A9XX1 Chronic gout, unspecified, with tophus (tophi): Secondary | ICD-10-CM

## 2021-04-20 MED ORDER — COLCHICINE 0.6 MG PO TABS
ORAL_TABLET | ORAL | 1 refills | Status: DC
  Filled 2021-04-20 – 2021-04-23 (×2): qty 10, 10d supply, fill #0

## 2021-04-20 MED ORDER — LOSARTAN POTASSIUM 50 MG PO TABS
ORAL_TABLET | Freq: Every day | ORAL | 0 refills | Status: DC
  Filled 2021-04-20: qty 90, fill #0
  Filled 2021-05-21: qty 30, 30d supply, fill #0
  Filled 2021-06-18: qty 30, 30d supply, fill #1
  Filled 2021-07-23: qty 30, 30d supply, fill #2

## 2021-04-20 NOTE — Progress Notes (Signed)
Virtual Visit via Telephone Note Due to national recommendations of social distancing due to COVID 19, telehealth visit is felt to be most appropriate for this patient at this time.  I discussed the limitations, risks, security and privacy concerns of performing an evaluation and management service by telephone and the availability of in person appointments. I also discussed with the patient that there may be a patient responsible charge related to this service. The patient expressed understanding and agreed to proceed.    I connected with Nathan Rubio on 04/20/21  at   4:10 PM EDT  EDT by telephone and verified that I am speaking with the correct person using two identifiers.   Consent I discussed the limitations, risks, security and privacy concerns of performing an evaluation and management service by telephone and the availability of in person appointments. I also discussed with the patient that there may be a patient responsible charge related to this service. The patient expressed understanding and agreed to proceed.   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 Currently taking allopurinol 600 mg daily and colchicine prn gout flare. Dr. Dimple Casey made recommendations for possible daily use of colchicine for the next month based on renal function.   Subclinical hyperthyroidism Per Endo clinically EUTHYROID. Currently being evaluated by Dr. Brooks Sailors. Thyroid US did not reveal any thyroid nodules. He is to follow up in 6 months.  Lab Results  Component Value Date   TSH  0.42 04/13/2021   Essential Hypertension Well controlled. Taking losartan 50 mg daily as prescribed. Denies chest pain, shortness of breath, palpitations, lightheadedness, dizziness, headaches or BLE edema.  BP Readings from Last 3 Encounters:  04/13/21 130/82  02/16/21 112/73  10/11/20 130/88    Past Medical History:  Diagnosis Date  . Gout   . Inflammatory polyarthritis (HCC)     No past surgical history on file.  Family History  Problem Relation Age of Onset  . Hypertension Sister   . Depression Neg Hx   . Diabetes Neg Hx     Social History   Socioeconomic History  . Marital status: Single    Spouse name: Not on file  . Number of children: Not on file  . Years of education: Not on file  . Highest education level: Not on file  Occupational History  . Not on file  Tobacco Use  . Smoking status: Never Smoker  . Smokeless tobacco: Never Used  Vaping Use  . Vaping Use: Never used  Substance and Sexual Activity  . Alcohol use: Not Currently  . Drug use: Never  . Sexual activity: Not on file  Other Topics Concern  . Not on file  Social History Narrative  . Not on file   Social Determinants of Health   Financial Resource Strain: Not on file  Food Insecurity: Not on file  Transportation Needs: Not on file  Physical Activity: Not on file  Stress: Not on file  Social Connections: Not on file     Observations/Objective: Awake, alert and oriented x 3   Review of Systems  Constitutional: Negative for fever, malaise/fatigue and weight loss.  HENT: Negative.  Negative for nosebleeds.   Eyes: Negative.  Negative for blurred vision, double vision and photophobia.  Respiratory: Negative.  Negative for cough and shortness of breath.   Cardiovascular: Negative.  Negative for chest pain, palpitations and leg swelling.  Gastrointestinal: Negative.  Negative for heartburn, nausea and vomiting.  Musculoskeletal: Negative.  Negative for myalgias.  Neurological: Negative.   Negative for dizziness, focal weakness, seizures and headaches.  Psychiatric/Behavioral: Negative.  Negative for suicidal ideas.    Assessment and Plan: Green was seen today for 3 month follow up.  Diagnoses and all orders for this visit:  Essential hypertension -     losartan (COZAAR) 50 MG tablet; TAKE 1 TABLET (50 MG TOTAL) BY MOUTH DAILY.  Stage 3b chronic kidney disease (HCC) -     losartan (COZAAR) 50 MG tablet; TAKE 1 TABLET (50 MG TOTAL) BY MOUTH DAILY.  Chronic gout with tophus, unspecified cause, unspecified site -     colchicine 0.6 MG tablet; TAKE 1 TABLET (0.6 MG TOTAL) BY MOUTH DAILY FOR 10 DAYS.     Follow Up Instructions No follow-ups on file.     I discussed the assessment and treatment plan with the patient. The patient was provided an opportunity to ask questions and all were answered. The patient agreed with the plan and demonstrated an understanding of the instructions.   The patient was advised to call back or seek an in-person evaluation if the symptoms worsen or if the condition fails to improve as anticipated.  I provided 12 minutes of non-face-to-face time during this encounter including median intraservice time, reviewing previous notes, labs, imaging, medications and explaining diagnosis and management.  Claiborne Rigg, FNP-BC

## 2021-04-23 ENCOUNTER — Other Ambulatory Visit: Payer: Self-pay

## 2021-04-30 ENCOUNTER — Other Ambulatory Visit: Payer: Self-pay

## 2021-05-04 ENCOUNTER — Ambulatory Visit: Payer: Self-pay | Attending: Nurse Practitioner

## 2021-05-04 ENCOUNTER — Other Ambulatory Visit: Payer: Self-pay

## 2021-05-22 ENCOUNTER — Other Ambulatory Visit: Payer: Self-pay

## 2021-05-23 ENCOUNTER — Other Ambulatory Visit: Payer: Self-pay

## 2021-06-19 ENCOUNTER — Other Ambulatory Visit: Payer: Self-pay

## 2021-07-02 ENCOUNTER — Ambulatory Visit: Payer: Self-pay

## 2021-07-02 NOTE — Telephone Encounter (Signed)
Pt. Reports he started having mild headache last week. BP this morning 155/115. Had been taking apple cider vinegar in the mornings to help with BP. Has stopped this. No missed doses of medications. Asking if he needs medications adjusted. Please advise pt.

## 2021-07-02 NOTE — Telephone Encounter (Signed)
Answer Assessment - Initial Assessment Questions 1. BLOOD PRESSURE: "What is the blood pressure?" "Did you take at least two measurements 5 minutes apart?"     155/115 2. ONSET: "When did you take your blood pressure?"     This morning 3. HOW: "How did you obtain the blood pressure?" (e.g., visiting nurse, automatic home BP monitor)     Home cuff 4. HISTORY: "Do you have a history of high blood pressure?"     Yes 5. MEDICATIONS: "Are you taking any medications for blood pressure?" "Have you missed any doses recently?"     Yes 6. OTHER SYMPTOMS: "Do you have any symptoms?" (e.g., headache, chest pain, blurred vision, difficulty breathing, weakness)     Mild headache 7. PREGNANCY: "Is there any chance you are pregnant?" "When was your last menstrual period?"     N/a  Protocols used: Blood Pressure - High-A-AH

## 2021-07-02 NOTE — Telephone Encounter (Signed)
Please schedule patient an appointment.  

## 2021-07-11 ENCOUNTER — Ambulatory Visit: Payer: Self-pay | Admitting: Physician Assistant

## 2021-07-11 ENCOUNTER — Ambulatory Visit: Payer: Self-pay | Attending: Nurse Practitioner

## 2021-07-11 ENCOUNTER — Other Ambulatory Visit: Payer: Self-pay

## 2021-07-11 VITALS — BP 132/88 | HR 89 | Temp 98.2°F | Resp 18 | Ht 66.0 in | Wt 187.0 lb

## 2021-07-11 DIAGNOSIS — I10 Essential (primary) hypertension: Secondary | ICD-10-CM

## 2021-07-11 NOTE — Progress Notes (Signed)
Established Patient Office Visit  Subjective:  Patient ID: Nathan Rubio, male    DOB: 08-09-1971  Age: 50 y.o. MRN: [id number]  CC:  Chief Complaint  Patient presents with   Hypertension    HPI Nathan Rubio reports that he has been having some elevated blood pressure readings at home.  Reports that he has been checking his blood pressure when he has a headache, states that he has had readings approximately 152/111, but also notes that yesterday after exercising he had a blood pressure reading of 118/85.  Reports that he is been having occasional headaches and elevated blood pressure readings in the past week.  Does endorse that the week prior he was on vacation for 2 weeks and does admit to much higher sodium diet.  Reports that he has been working on lowering his sodium, is drinking approximately 4 or 5 cups of water a day, states that he has been increasing the times he goes to exercise.   Past Medical History:  Diagnosis Date   Gout    Inflammatory polyarthritis (HCC)     History reviewed. No pertinent surgical history.  Family History  Problem Relation Age of Onset   Hypertension Sister    Depression Neg Hx    Diabetes Neg Hx     Social History   Socioeconomic History   Marital status: Single    Spouse name: Not on file   Number of children: Not on file   Years of education: Not on file   Highest education level: Not on file  Occupational History   Not on file  Tobacco Use   Smoking status: Never   Smokeless tobacco: Never  Vaping Use   Vaping Use: Never used  Substance and Sexual Activity   Alcohol use: Not Currently   Drug use: Never   Sexual activity: Not Currently  Other Topics Concern   Not on file  Social History Narrative   Not on file   Social Determinants of Health   Financial Resource Strain: Not on file  Food Insecurity: Not on file  Transportation Needs: Not on file  Physical Activity: Not on file  Stress: Not on file  Social  Connections: Not on file  Intimate Partner Violence: Not on file    Outpatient Medications Prior to Visit  Medication Sig Dispense Refill   allopurinol (ZYLOPRIM) 300 MG tablet TAKE 2 TABLETS (600 MG TOTAL) BY MOUTH DAILY. 180 tablet 0   colchicine 0.6 MG tablet TAKE 1 TABLET (0.6 MG TOTAL) BY MOUTH DAILY FOR 10 DAYS. 10 tablet 1   losartan (COZAAR) 50 MG tablet TAKE 1 TABLET (50 MG TOTAL) BY MOUTH DAILY. 90 tablet 0   No facility-administered medications prior to visit.    No Known Allergies  ROS Review of Systems  Constitutional: Negative.   HENT: Negative.    Eyes: Negative.   Respiratory:  Negative for shortness of breath.   Cardiovascular:  Negative for chest pain and palpitations.  Gastrointestinal: Negative.   Endocrine: Negative.   Genitourinary: Negative.   Musculoskeletal: Negative.   Skin: Negative.   Allergic/Immunologic: Negative.   Neurological:  Positive for headaches.  Hematological: Negative.   Psychiatric/Behavioral: Negative.       Objective:    Physical Exam Vitals and nursing note reviewed.  Constitutional:      General: He is not in acute distress.    Appearance: Normal appearance. He is not ill-appearing.  HENT:     Head: Normocephalic and atraumatic.  Right Ear: External ear normal.     Left Ear: External ear normal.     Nose: Nose normal.     Mouth/Throat:     Mouth: Mucous membranes are moist.     Pharynx: Oropharynx is clear.  Eyes:     Extraocular Movements: Extraocular movements intact.     Conjunctiva/sclera: Conjunctivae normal.     Pupils: Pupils are equal, round, and reactive to light.  Cardiovascular:     Rate and Rhythm: Normal rate and regular rhythm.     Pulses: Normal pulses.     Heart sounds: Normal heart sounds.  Pulmonary:     Effort: Pulmonary effort is normal.     Breath sounds: Normal breath sounds.  Musculoskeletal:        General: Normal range of motion.     Cervical back: Normal range of motion and neck  supple.  Skin:    General: Skin is warm and dry.  Neurological:     General: No focal deficit present.     Mental Status: He is alert and oriented to person, place, and time.  Psychiatric:        Mood and Affect: Mood normal.        Behavior: Behavior normal.        Thought Content: Thought content normal.        Judgment: Judgment normal.    BP 132/88 (BP Location: Left Arm, Patient Position: Sitting, Cuff Size: Normal)   Pulse 89   Temp 98.2 F (36.8 C) (Oral)   Resp 18   Ht 5\' 6"  (1.676 m)   Wt 187 lb (84.8 kg)   SpO2 99%   BMI 30.18 kg/m  Wt Readings from Last 3 Encounters:  07/11/21 187 lb (84.8 kg)  04/13/21 186 lb 8 oz (84.6 kg)  02/16/21 182 lb (82.6 kg)     Health Maintenance Due  Topic Date Due   Pneumococcal Vaccine 36-26 Years old (1 - PCV) Never done   TETANUS/TDAP  Never done   COLONOSCOPY (Pts 45-20yrs Insurance coverage will need to be confirmed)  Never done   COVID-19 Vaccine (4 - Booster for Pfizer series) 04/05/2021    There are no preventive care reminders to display for this patient.  Lab Results  Component Value Date   TSH 0.42 04/13/2021   Lab Results  Component Value Date   WBC 8.0 01/15/2021   HGB 17.0 01/15/2021   HCT 49.2 01/15/2021   MCV 90 01/15/2021   PLT 281 01/15/2021   Lab Results  Component Value Date   NA 140 02/16/2021   K 4.7 02/16/2021   CO2 17 (L) 02/16/2021   GLUCOSE 90 02/16/2021   BUN 22 02/16/2021   CREATININE 1.68 (H) 02/16/2021   BILITOT 0.3 01/15/2021   ALKPHOS 120 01/15/2021   AST 24 01/15/2021   ALT 14 01/15/2021   PROT 7.8 01/15/2021   ALBUMIN 4.8 01/15/2021   CALCIUM 10.3 (H) 02/16/2021   Lab Results  Component Value Date   CHOL 205 (H) 02/16/2021   Lab Results  Component Value Date   HDL 45 02/16/2021   Lab Results  Component Value Date   LDLCALC 135 (H) 02/16/2021   Lab Results  Component Value Date   TRIG 138 02/16/2021   Lab Results  Component Value Date   CHOLHDL 4.6 02/16/2021    No results found for: HGBA1C    Assessment & Plan:   Problem List Items Addressed This Visit  Cardiovascular and Mediastinum   Essential hypertension - Primary   Other Visit Diagnoses     Elevated blood pressure reading with diagnosis of hypertension           No orders of the defined types were placed in this encounter. 1. Essential hypertension Patient education given on low-sodium diet, increasing water intake.  Patient to come by mobile unit to verify accuracy of home blood pressure cuff.  Patient encouraged to return to mobile unit promptly if blood pressure readings continue to be elevated.  Patient education given on proper blood pressure reading procedure.  Encouraged to check blood pressure several times a day, keep a written log and have available for all office visits.  Red flags given for prompt reevaluation.   I have reviewed the patient's medical history (PMH, PSH, Social History, Family History, Medications, and allergies) , and have been updated if relevant. I spent 23 minutes reviewing chart and  face to face time with patient.   Follow-up: Return if symptoms worsen or fail to improve.    Kasandra Knudsen Mayers, PA-C

## 2021-07-11 NOTE — Patient Instructions (Signed)
I encourage you to check your blood pressure several times a day, keep a written log and have available for all office visits.  I encourage you to increase your water intake and lower your sodium intake.  If your blood pressures remain elevated, please feel free to return to the mobile unit for prompt follow-up.  Please also feel free to come by the mobile unit and we will be happy to verify that your blood pressure cuff is accurate.  Roney Jaffe, PA-C Physician Assistant [facility] [email or web address]   How to Take Your Blood Pressure Blood pressure is a measurement of how strongly your blood is pressing against the walls of your arteries. Arteries are blood vessels that carry blood from your heart throughout your body. Your health care provider takes your blood pressure at each office visit. You can also take your own blood pressure athome with a blood pressure monitor. You may need to take your own blood pressure to: Confirm a diagnosis of high blood pressure (hypertension). Monitor your blood pressure over time. Make sure your blood pressure medicine is working. Supplies needed: Blood pressure monitor. Dining room chair to sit in. Table or desk. Small notebook and pencil or pen. How to prepare To get the most accurate reading, avoid the following for 30 minutes before you check your blood pressure: Drinking caffeine. Drinking alcohol. Eating. Smoking. Exercising. Five minutes before you check your blood pressure: Use the bathroom and urinate so that you have an empty bladder. Sit quietly in a dining room chair. Do not sit in a soft couch or an armchair. Do not talk. How to take your blood pressure To check your blood pressure, follow the instructions in the manual that came with your blood pressure monitor. If you have a digital blood pressure monitor, the instructions may be as follows: Sit up straight in a chair. Place  your feet on the floor. Do not cross your ankles or legs. Rest your left arm at the level of your heart on a table or desk or on the arm of a chair. Pull up your shirt sleeve. Wrap the blood pressure cuff around the upper part of your left arm, 1 inch (2.5 cm) above your elbow. It is best to wrap the cuff around bare skin. Fit the cuff snugly around your arm. You should be able to place only one finger between the cuff and your arm. Position the cord so that it rests in the bend of your elbow. Press the power button. Sit quietly while the cuff inflates and deflates. Read the digital reading on the monitor screen and write the numbers down (record them) in a notebook. Wait 2-3 minutes, then repeat the steps, starting at step 1. What does my blood pressure reading mean? A blood pressure reading consists of a higher number over a lower number. Ideally, your blood pressure should be below 120/80. The first ("top") number is called the systolic pressure. It is a measure of the pressure in your arteries as your heart beats. The second ("bottom") number is called the diastolic pressure. It is a measure of the pressure in your arteries as theheart relaxes. Blood pressure is classified into five stages. The following are the stages for adults who do not have a short-term serious illness or a chronic condition. Systolic pressure and diastolic pressure are measured in a unit called mm Hg (millimeters of mercury). Normal Systolic pressure: below 120. Diastolic pressure: below 80. Elevated Systolic pressure: 120-129. Diastolic pressure: below  80. Hypertension stage 1 Systolic pressure: 130-139. Diastolic pressure: 80-89. Hypertension stage 2 Systolic pressure: 140 or above. Diastolic pressure: 90 or above. You can have elevated blood pressure or hypertension even if only the systolicor only the diastolic number in your reading is higher than normal. Follow these instructions at home: Medicines Take  over-the-counter and prescription medicines only as told by your health care provider. Tell your health care provider if you are having any side effects from blood pressure medicine. General instructions Check your blood pressure as often as recommended by your health care provider. Check your blood pressure at the same time every day. Take your monitor to the next appointment with your health care provider to make sure that: You are using it correctly. It provides accurate readings. Understand what your goal blood pressure numbers are. Keep all follow-up visits as told by your health care provider. This is important. General tips Your health care provider can suggest a reliable monitor that will meet your needs. There are several types of home blood pressure monitors. Choose a monitor that has an arm cuff. Do not choose a monitor that measures your blood pressure from your wrist or finger. Choose a cuff that wraps snugly around your upper arm. You should be able to fit only one finger between your arm and the cuff. You can buy a blood pressure monitor at most drugstores or online. Where to find more information American Heart Association: www.heart.org Contact a health care provider if: Your blood pressure is consistently high. Your blood pressure is suddenly low. Get help right away if: Your systolic blood pressure is higher than 180. Your diastolic blood pressure is higher than 120. Summary Blood pressure is a measurement of how strongly your blood is pressing against the walls of your arteries. A blood pressure reading consists of a higher number over a lower number. Ideally, your blood pressure should be below 120/80. Check your blood pressure at the same time every day. Avoid caffeine, alcohol, smoking, and exercise for 30 minutes prior to checking your blood pressure. These agents can affect the accuracy of the blood pressure reading. This information is not intended to replace  advice given to you by your health care provider. Make sure you discuss any questions you have with your healthcare provider. Document Revised: 10/18/2020 Document Reviewed: 12/03/2019 Elsevier Patient Education  2022 Elsevier Inc.  Cooking With Less [employer] with less salt is one way to reduce the amount of sodium you get from food. Sodium is one of the elements that make up salt. It is found naturally in foods and is also added to certain foods. Depending on your condition and overall health, your health care provider or dietitian may recommend that you reduce your sodium intake. Most people should have less than 2,300 milligrams (mg) of sodium each day. If you have high blood pressure (hypertension), you may need to limit your sodium to 1,500 mg each day. Follow the tipsbelow to help reduce your sodium intake. What are tips for eating less sodium? Reading food labels  Check the food label before buying or using packaged ingredients. Always check the label for the serving size and sodium content. Look for products with no more than 140 mg of sodium in one serving. Check the % Daily Value column to see what percent of the daily recommended amount of sodium is provided in one serving of the product. Foods with 5% or less in this column are considered low in sodium. Foods with 20%  or higher are considered high in sodium. Do not choose foods with salt as one of the first three ingredients on the ingredients list. If salt is one of the first three ingredients, it usually means the item is high in sodium.  Shopping Buy sodium-free or low-sodium products. Look for the following words on food labels: Low-sodium. Sodium-free. Reduced-sodium. No salt added. Unsalted. Always check the sodium content even if foods are labeled as low-sodium or no salt added. Buy fresh foods. Cooking Use herbs, seasonings without salt, and spices as substitutes for salt. Use sodium-free baking soda when  baking. Grill, braise, or roast foods to add flavor with less salt. Avoid adding salt to pasta, rice, or hot cereals. Drain and rinse canned vegetables, beans, and meat before use. Avoid adding salt when cooking sweets and desserts. Cook with low-sodium ingredients. What foods are high in sodium? Vegetables Regular canned vegetables (not low-sodium or reduced-sodium). Sauerkraut, pickled vegetables, and relishes. Olives. [place] fries. Onion rings. Regular canned tomato sauce and paste. Regular tomato and vegetable juice. Frozenvegetables in sauces. Grains Instant hot cereals. Bread stuffing, pancake, and biscuit mixes. Croutons. Seasoned rice or pasta mixes. Noodle soup cups. Boxed or frozen macaroni and cheese. Regular salted crackers. Self-rising flour. Rolls. Bagels. Flourtortillas and wraps. Meats and other proteins Meat or fish that is salted, canned, smoked, cured, spiced, or pickled. This includes bacon, ham, sausages, hot dogs, corned beef, chipped beef, meat loaves, salt pork, jerky, pickled herring, anchovies, regular canned tuna, andsardines. Salted nuts. Dairy Processed cheese and cheese spreads. Cheese curds. Blue cheese. Feta cheese.String cheese. Regular cottage cheese. Buttermilk. Canned milk. The items listed above may not be a complete list of foods high in sodium. Actual amounts of sodium may be different depending on processing. Contact a dietitian for more information. What foods are low in sodium? Fruits Fresh, frozen, or canned fruit with no sauce added. Fruit juice. Vegetables Fresh or frozen vegetables with no sauce added. "No salt added" canned vegetables. "No salt added" tomato sauce and paste. Low-sodium orreduced-sodium tomato and vegetable juice. Grains Noodles, pasta, quinoa, rice. Shredded or puffed wheat or puffed rice. Regular or quick oats (not instant). Low-sodium crackers. Low-sodium bread. Whole-grainbread and whole-grain pasta. Unsalted popcorn. Meats and  other proteins Fresh or frozen whole meats, poultry (not injected with sodium), and fish with no sauce added. Unsalted nuts. Dried peas, beans, and lentils without added salt. Unsalted canned beans. Eggs. Unsalted nut butters. Low-sodium canned tunaor chicken. Dairy Milk. Soy milk. Yogurt. Low-sodium cheeses, such as Swiss, [address], [place], and [employer]. Sherbet or ice cream (keep to  cup per serving).Cream cheese. Fats and oils Unsalted butter or margarine. Other foods Homemade pudding. Sodium-free baking soda and baking powder. Herbs and spices.Low-sodium seasoning mixes. Beverages Coffee and tea. Carbonated beverages. The items listed above may not be a complete list of foods low in sodium. Actual amounts of sodium may be different depending on processing. Contact a dietitian for more information. What are some salt alternatives when cooking? The following are herbs, seasonings, and spices that can be used instead of salt to flavor your food. Herbs should be fresh or dried. Do not choose packaged mixes. Next to the name of the herb, spice, or seasoning aresome examples of foods you can pair it with. Herbs [place] leaves - Soups, meat and vegetable dishes, and spaghetti sauce. Basil - [employer], soups, pasta, and fish dishes. Cilantro - Meat, poultry, and vegetable dishes. Chili powder - Marinades and Mexican dishes. Chives -  Salad dressings and potato dishes. Cumin - Mexican dishes, couscous, and meat dishes. Dill - Fish dishes, sauces, and salads. Fennel - Meat and vegetable dishes, breads, and cookies. Garlic (do not use garlic salt) - [place] dishes, meat dishes, salad dressings, and sauces. Marjoram - Soups, potato dishes, and meat dishes. Oregano - Pizza and spaghetti sauce. Parsley - Salads, soups, pasta, and meat dishes. Rosemary - [place] dishes, salad dressings, soups, and red meats. Saffron - Fish dishes, pasta, and some poultry dishes. Sage - Stuffings and  sauces. Tarragon - Fish and Whole Foodspoultry dishes. Thyme - Stuffing, meat, and fish dishes. Seasonings Lemon juice - Fish dishes, poultry dishes, vegetables, and salads. Vinegar - Salad dressings, vegetables, and fish dishes. Spices Cinnamon - Sweet dishes, such as cakes, cookies, and puddings. Cloves - Gingerbread, puddings, and marinades for meats. Curry - Vegetable dishes, fish and poultry dishes, and stir-fry dishes. Ginger - Vegetable dishes, fish dishes, and stir-fry dishes. Nutmeg - Pasta, vegetables, poultry, fish dishes, and custard. Summary Cooking with less salt is one way to reduce the amount of sodium that you get from food. Buy sodium-free or low-sodium products. Check the food label before using or buying packaged ingredients. Use herbs, seasonings without salt, and spices as substitutes for salt in foods. This information is not intended to replace advice given to you by your health care provider. Make sure you discuss any questions you have with your healthcare provider. Document Revised: 12/01/2019 Document Reviewed: 12/01/2019 Elsevier Patient Education  2022 [employer]

## 2021-07-11 NOTE — Progress Notes (Signed)
Patient has eaten today and patient has taken medication today. Patient denies HA, dizziness. Patient denies pain at this time. Patient reports 148/109 before medication. Patient reports 2 weeks ago having a HA from returning home from the beach.

## 2021-07-23 ENCOUNTER — Other Ambulatory Visit: Payer: Self-pay | Admitting: Nurse Practitioner

## 2021-07-23 ENCOUNTER — Other Ambulatory Visit: Payer: Self-pay

## 2021-07-23 DIAGNOSIS — M1A9XX1 Chronic gout, unspecified, with tophus (tophi): Secondary | ICD-10-CM

## 2021-07-23 MED ORDER — ALLOPURINOL 300 MG PO TABS
ORAL_TABLET | ORAL | 0 refills | Status: DC
  Filled 2021-07-23: qty 60, 30d supply, fill #0
  Filled 2021-08-24: qty 60, 30d supply, fill #1
  Filled 2021-10-02: qty 60, 30d supply, fill #2

## 2021-07-24 ENCOUNTER — Other Ambulatory Visit: Payer: Self-pay

## 2021-08-17 ENCOUNTER — Ambulatory Visit: Payer: Self-pay | Admitting: Internal Medicine

## 2021-08-24 ENCOUNTER — Other Ambulatory Visit: Payer: Self-pay | Admitting: Nurse Practitioner

## 2021-08-24 ENCOUNTER — Other Ambulatory Visit: Payer: Self-pay

## 2021-08-24 DIAGNOSIS — N1832 Chronic kidney disease, stage 3b: Secondary | ICD-10-CM

## 2021-08-24 DIAGNOSIS — I10 Essential (primary) hypertension: Secondary | ICD-10-CM

## 2021-08-24 MED ORDER — LOSARTAN POTASSIUM 50 MG PO TABS
50.0000 mg | ORAL_TABLET | Freq: Every day | ORAL | 2 refills | Status: DC
  Filled 2021-08-24: qty 30, 30d supply, fill #0
  Filled 2021-10-02: qty 30, 30d supply, fill #1
  Filled 2021-11-05: qty 30, 30d supply, fill #2

## 2021-08-24 NOTE — Telephone Encounter (Signed)
Requested medication (s) are due for refill today: Yes  Requested medication (s) are on the active medication list: Yes  Last refill:  04/20/21  Future visit scheduled: No  Notes to clinic:  Protocol indicates pt. Needs lab work.    Requested Prescriptions  Pending Prescriptions Disp Refills   losartan (COZAAR) 50 MG tablet 90 tablet 0    Sig: TAKE 1 TABLET (50 MG TOTAL) BY MOUTH DAILY.     Cardiovascular:  Angiotensin Receptor Blockers Failed - 08/24/2021  9:59 AM      Failed - Cr in normal range and within 180 days    Creatinine, Ser  Date Value Ref Range Status  02/16/2021 1.68 (H) 0.76 - 1.27 mg/dL Final    Comment:                   **Effective February 19, 2021 [facility] will begin**                  reporting the 2021 CKD-EPI creatinine equation that                  estimates kidney function without a race variable.           Failed - K in normal range and within 180 days    Potassium  Date Value Ref Range Status  02/16/2021 4.7 3.5 - 5.2 mmol/L Final          Passed - Patient is not pregnant      Passed - Last BP in normal range    BP Readings from Last 1 Encounters:  07/11/21 132/88          Passed - Valid encounter within last 6 months    Recent Outpatient Visits           4 months ago Essential hypertension   [facility] [place], [place] W, NP   7 months ago Dyslipidemia, goal LDL below 100   [facility] [place], [place] W, NP   10 months ago Chronic gout with tophus, unspecified cause, unspecified site   [facility] [place], [place] W, NP   1 year ago Chronic gout with tophus, unspecified cause, unspecified site   [facility] [place], [place] W, NP   2 years ago Chronic gout of multiple sites, unspecified cause   [facility] [address] [facility] [place], [place] Stakes, NP       Future Appointments             In 1 week Rice,  Jamesetta Orleans, MD [facility] Rheumatology

## 2021-08-28 ENCOUNTER — Other Ambulatory Visit: Payer: Self-pay

## 2021-08-29 ENCOUNTER — Other Ambulatory Visit: Payer: Self-pay

## 2021-09-04 NOTE — Progress Notes (Signed)
Office Visit Note  Patient: Nathan Rubio             Date of Birth: Jan 08, 1971           MRN: [id number]             PCP: Claiborne Rigg, NP Referring: Claiborne Rigg, NP Visit Date: 09/05/2021   Subjective:  Follow-up (Patient is taking Allopurinol 600 mg daily and Colchicine 0.6 mg daily. Patient feels as if gout is well controlled.)   History of Present Illness: Nathan Rubio is a 50 y.o. male here for follow up for chronic, polyarticular, tophaceous, erosive gouty arthritis on allopurinol 600 mg daily and colchicine 0.6 mg daily as needed.  Overall since last visit doing reasonably well.  He feels there have been a few minor gout flareups but no very severe episodes.  Most recently involved was left knee pain and stiffness that has improved.  He denies any trouble taking the medication.  Previous HPI 02/16/21 Nathan Rubio is a 50 y.o. male with HTN, HLD, CKD stage 3 here for evaluation and management of chronic tophaceous gout.  He has gout flares since at least 25 years ago with varying frequency but somewhere between 3-6 times per year most of the time.  More recently he is also had persistent pain and stiffness even between the specific flares.  He was treated with allopurinol in the past but did not stay on this medicine consistently due to lack of medical follow-up. He used to take NSAIDs OTC heavily for symptoms but stopped after being found to have CKD. 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 He was previously seen with Dr. Nydia Bouton at [facility] rheumatology but not following up due to costs. 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 He has been back on allopurinol not taking 600 mg daily for few months most recent uric acid was at goal in November.  He is taking colchicine as needed for flares which is usually effective within 2 to 3 days.      Labs reviewed 12/2020 eGFR 47 CBC wnl   09/2020 TSH 0.359 fT4 1.31 fT3 3.0   09/2020 Uric acid 4.9    Review of Systems  Constitutional:  Negative for fatigue.  HENT:  Negative for mouth sores, mouth dryness and nose dryness.   Eyes:  Negative for pain, itching, visual disturbance and dryness.  Respiratory:  Negative for cough, hemoptysis, shortness of breath and difficulty breathing.   Cardiovascular:  Negative for chest pain, palpitations and swelling in legs/feet.  Gastrointestinal:  Negative for abdominal pain, blood in stool, constipation and diarrhea.  Endocrine: Negative for increased urination.  Genitourinary:  Negative for painful urination.  Musculoskeletal:  Negative for joint pain, joint pain, joint swelling, myalgias, muscle weakness, morning stiffness, muscle tenderness and myalgias.  Skin:  Negative for color change, rash and redness.  Allergic/Immunologic: Negative for susceptible to infections.  Neurological:  Negative for dizziness, numbness, headaches, memory loss and weakness.  Hematological:  Negative for swollen glands.  Psychiatric/Behavioral:  Negative for confusion and sleep disturbance.    PMFS History:  Patient Active Problem List   Diagnosis Date Noted   CKD (chronic kidney disease) stage 3, GFR 30-59 ml/min (HCC) 03/20/2020   Essential hypertension 03/20/2020   Idiopathic chronic gout of multiple sites with tophus 03/20/2020    Past Medical History:  Diagnosis Date  Gout    Inflammatory polyarthritis ([place])     Family History  Problem Relation Age of Onset   Hypertension Sister    Depression Neg Hx    Diabetes Neg Hx    History reviewed. No pertinent surgical history. Social History   Social History Narrative   Not on file   Immunization History  Administered Date(s) Administered   PFIZER(Purple Top)SARS-COV-2 Vaccination 04/14/2020, 05/14/2020, 01/05/2021     Objective: Vital Signs: BP (!) 135/96 (BP Location: Left Arm,  Patient Position: Sitting, Cuff Size: Normal)   Pulse 90   Ht $R'5\' 6"'eR$  (1.676 m)   Wt 187 lb (84.8 kg)   BMI 30.18 kg/m    Physical Exam Cardiovascular:     Rate and Rhythm: Normal rate and regular rhythm.  Pulmonary:     Effort: Pulmonary effort is normal.     Breath sounds: Normal breath sounds.  Skin:    General: Skin is warm and dry.     Findings: No rash.  Neurological:     Mental Status: He is alert.  Psychiatric:        Mood and Affect: Mood normal.     Musculoskeletal Exam:  Neck full ROM no tenderness Shoulders full ROM no tenderness or swelling Bilateral elbow tophi, full ROM no tenderness Decreased wrist and finger joints ROM bilaterally, chronic MCP joint enlargement, mild swan neck deformities at 4th finger Knee patellofemoral crepitus bilaterally no swelling, some left knee tenderness Ankle ROM reduced bilaterally, 1st MTP joint chronic enlargement     Investigation: No additional findings.  Imaging: No results found.  Recent Labs: Lab Results  Component Value Date   WBC 8.0 01/15/2021   HGB 17.0 01/15/2021   PLT 281 01/15/2021   NA 137 09/05/2021   K 4.0 09/05/2021   CL 104 09/05/2021   CO2 27 09/05/2021   GLUCOSE 95 09/05/2021   BUN 22 (H) 09/05/2021   CREATININE 1.74 (H) 09/05/2021   BILITOT 0.3 01/15/2021   ALKPHOS 120 01/15/2021   AST 24 01/15/2021   ALT 14 01/15/2021   PROT 7.8 01/15/2021   ALBUMIN 4.8 01/15/2021   CALCIUM 9.5 09/05/2021   GFRAA 54 (L) 02/16/2021    Speciality Comments: No specialty comments available.  Procedures:  No procedures performed Allergies: Patient has no known allergies.   Assessment / Plan:     Visit Diagnoses: Idiopathic chronic gout of multiple sites with tophus Chronic gout with tophus, unspecified cause, unspecified site - Plan: colchicine 0.6 MG tablet  Symptoms appear reasonably well controlled on the current allopurinol dose. Continue allopurinol 600 mg daily and colchicine 0.6 mg p.o. daily  as needed.  Checking uric acid also repeat a metabolic panel for drug monitoring due to his chronic kidney disease.  No large changes needed can follow-up in 6 months.  Stage 3a chronic kidney disease (HCC)  Rechecking metabolic panel as above.    Orders: No orders of the defined types were placed in this encounter.  Meds ordered this encounter  Medications   colchicine 0.6 MG tablet    Sig: Take 1 tablet (0.6 mg total) by mouth daily as needed (for gout flare).    Dispense:  20 tablet    Refill:  1      Follow-Up Instructions: Return in about 6 months (around 03/05/2022) for Gout on allopurinol f/u 52mos.   Collier Salina, MD  Note - This record has been created using [employer].  Chart creation errors have been sought, but may  not always  have been located. Such creation errors do not reflect on  the standard of medical care.  

## 2021-09-05 ENCOUNTER — Other Ambulatory Visit: Payer: Self-pay

## 2021-09-05 ENCOUNTER — Other Ambulatory Visit (HOSPITAL_COMMUNITY)
Admission: RE | Admit: 2021-09-05 | Discharge: 2021-09-05 | Disposition: A | Discharge status: Home / Self Care | Payer: No Typology Code available for payment source | Source: Other Acute Inpatient Hospital | Attending: Internal Medicine | Admitting: Internal Medicine

## 2021-09-05 ENCOUNTER — Ambulatory Visit (INDEPENDENT_AMBULATORY_CARE_PROVIDER_SITE_OTHER): Payer: Self-pay | Admitting: Internal Medicine

## 2021-09-05 ENCOUNTER — Encounter: Payer: Self-pay | Admitting: Internal Medicine

## 2021-09-05 VITALS — BP 135/96 | HR 90 | Ht 66.0 in | Wt 187.0 lb

## 2021-09-05 DIAGNOSIS — N1831 Chronic kidney disease, stage 3a: Secondary | ICD-10-CM

## 2021-09-05 DIAGNOSIS — M1A09X1 Idiopathic chronic gout, multiple sites, with tophus (tophi): Secondary | ICD-10-CM | POA: Insufficient documentation

## 2021-09-05 DIAGNOSIS — M1A9XX1 Chronic gout, unspecified, with tophus (tophi): Secondary | ICD-10-CM

## 2021-09-05 LAB — BASIC METABOLIC PANEL
Anion gap: 6 (ref 5–15)
BUN: 22 mg/dL — ABNORMAL HIGH (ref 6–20)
CO2: 27 mmol/L (ref 22–32)
Calcium: 9.5 mg/dL (ref 8.9–10.3)
Chloride: 104 mmol/L (ref 98–111)
Creatinine, Ser: 1.74 mg/dL — ABNORMAL HIGH (ref 0.61–1.24)
GFR, Estimated: 47 mL/min — ABNORMAL LOW (ref >60)
Glucose, Bld: 95 mg/dL (ref 70–99)
Potassium: 4 mmol/L (ref 3.5–5.1)
Sodium: 137 mmol/L (ref 135–145)

## 2021-09-05 LAB — URIC ACID: Uric Acid, Serum: 5.7 mg/dL (ref 3.7–8.6)

## 2021-09-05 MED ORDER — COLCHICINE 0.6 MG PO TABS
0.6000 mg | ORAL_TABLET | Freq: Every day | ORAL | 1 refills | Status: DC | PRN
  Filled 2021-09-05: qty 20, 20d supply, fill #0

## 2021-09-24 ENCOUNTER — Telehealth: Payer: Self-pay | Admitting: *Deleted

## 2021-09-24 NOTE — Telephone Encounter (Signed)
-----   Message from Fuller Plan, MD sent at 09/22/2021  9:22 PM EDT ----- Regarding: Labs f/u Labs from most recent visit 9/14 were drawn at hospital I did not see these resulted to me so no result note but please contact patient:  His uric is at goal 5.7 which is up form 4.6 in February but still less than 6. Metabolic panel shows stable kidney function numbers. No changes to medications needed.

## 2021-09-24 NOTE — Telephone Encounter (Signed)
Patient advised His uric is at goal 5.7 which is up form 4.6 in February but still less than 6. Metabolic panel shows stable kidney function numbers. No changes to medications needed.

## 2021-10-03 ENCOUNTER — Other Ambulatory Visit: Payer: Self-pay

## 2021-10-19 ENCOUNTER — Ambulatory Visit: Payer: No Typology Code available for payment source | Admitting: Internal Medicine

## 2021-11-05 ENCOUNTER — Other Ambulatory Visit: Payer: Self-pay

## 2021-11-05 ENCOUNTER — Other Ambulatory Visit: Payer: Self-pay | Admitting: Nurse Practitioner

## 2021-11-05 DIAGNOSIS — M1A9XX1 Chronic gout, unspecified, with tophus (tophi): Secondary | ICD-10-CM

## 2021-11-05 MED ORDER — ALLOPURINOL 300 MG PO TABS
ORAL_TABLET | ORAL | 0 refills | Status: DC
  Filled 2021-11-05: qty 60, 30d supply, fill #0
  Filled 2021-12-13: qty 60, 30d supply, fill #1
  Filled 2022-01-25: qty 60, 30d supply, fill #0
  Filled 2022-01-25: qty 60, 30d supply, fill #2

## 2021-11-05 NOTE — Telephone Encounter (Signed)
Requested Prescriptions  Pending Prescriptions Disp Refills  . allopurinol (ZYLOPRIM) 300 MG tablet 180 tablet 0    Sig: TAKE 2 TABLETS (600 MG TOTAL) BY MOUTH DAILY.     Endocrinology:  Gout Agents Failed - 11/05/2021  8:28 AM      Failed - Cr in normal range and within 360 days    Creatinine, Ser  Date Value Ref Range Status  09/05/2021 1.74 (H) 0.61 - 1.24 mg/dL Final         Passed - Uric Acid in normal range and within 360 days    Uric Acid, Serum  Date Value Ref Range Status  09/05/2021 5.7 3.7 - 8.6 mg/dL Final    Comment:    Performed at [facility], [address] [place], [place] [address]   Uric Acid  Date Value Ref Range Status  02/16/2021 4.6 3.8 - 8.4 mg/dL Final    Comment:               Therapeutic target for gout patients: <6.0         Passed - Valid encounter within last 12 months    Recent Outpatient Visits          6 months ago Essential hypertension   [place] [facility] [place], [place] W, NP   9 months ago Dyslipidemia, goal LDL below 100   [facility] [place], [place] W, NP   1 year ago Chronic gout with tophus, unspecified cause, unspecified site   [facility] [place], [place] W, NP   2 years ago Chronic gout with tophus, unspecified cause, unspecified site   [facility] [place], [place] W, NP   2 years ago Chronic gout of multiple sites, unspecified cause   [facility] [address] [facility] [place], Shea Stakes, NP      Future Appointments            In 4 months Rice, Jamesetta Orleans, MD [facility]umatology

## 2021-12-13 ENCOUNTER — Other Ambulatory Visit: Payer: Self-pay | Admitting: Family Medicine

## 2021-12-13 DIAGNOSIS — N1832 Chronic kidney disease, stage 3b: Secondary | ICD-10-CM

## 2021-12-13 DIAGNOSIS — I10 Essential (primary) hypertension: Secondary | ICD-10-CM

## 2021-12-13 MED ORDER — LOSARTAN POTASSIUM 50 MG PO TABS
50.0000 mg | ORAL_TABLET | Freq: Every day | ORAL | 0 refills | Status: DC
  Filled 2021-12-13: qty 30, 30d supply, fill #0

## 2021-12-13 NOTE — Telephone Encounter (Signed)
Requested Prescriptions  Pending Prescriptions Disp Refills   losartan (COZAAR) 50 MG tablet 30 tablet 0    Sig: Take 1 tablet (50 mg total) by mouth daily.     Cardiovascular:  Angiotensin Receptor Blockers Failed - 12/13/2021  9:49 PM      Failed - Cr in normal range and within 180 days    Creatinine, Ser  Date Value Ref Range Status  09/05/2021 1.74 (H) 0.61 - 1.24 mg/dL Final         Failed - Last BP in normal range    BP Readings from Last 1 Encounters:  09/05/21 (!) 135/96         Failed - Valid encounter within last 6 months    Recent Outpatient Visits          7 months ago Essential hypertension   [place] [facility] [place], [place] W, NP   11 months ago Dyslipidemia, goal LDL below 100   [facility] [place], [place] W, NP   1 year ago Chronic gout with tophus, unspecified cause, unspecified site   [facility] [place], [place] W, NP   2 years ago Chronic gout with tophus, unspecified cause, unspecified site   [facility] [place], [place] W, NP   2 years ago Chronic gout of multiple sites, unspecified cause   [place] [place], Shea Stakes, NP      Future Appointments            In 2 months Rice, Jamesetta Orleans, MD [facility] Rheumatology           Passed - K in normal range and within 180 days    Potassium  Date Value Ref Range Status  09/05/2021 4.0 3.5 - 5.1 mmol/L Final         Passed - Patient is not pregnant

## 2021-12-14 ENCOUNTER — Other Ambulatory Visit: Payer: Self-pay

## 2021-12-18 ENCOUNTER — Other Ambulatory Visit: Payer: Self-pay

## 2022-01-25 ENCOUNTER — Other Ambulatory Visit: Payer: Self-pay | Admitting: Nurse Practitioner

## 2022-01-25 ENCOUNTER — Other Ambulatory Visit: Payer: Self-pay

## 2022-01-25 DIAGNOSIS — I10 Essential (primary) hypertension: Secondary | ICD-10-CM

## 2022-01-25 DIAGNOSIS — N1832 Chronic kidney disease, stage 3b: Secondary | ICD-10-CM

## 2022-01-25 NOTE — Telephone Encounter (Signed)
Requested medication (s) are due for refill today:   Yes  Requested medication (s) are on the active medication list:   Yes  Future visit scheduled:   Yes  I called and got him an appt for 03/29/2022 with Zelda   Last ordered: 12/13/2021 #30, 0 refills  Returned because courtesy refill given in Dec.   Provider to review for refills prior to upcoming appt.  Lab criteria not met.  Requested Prescriptions  Pending Prescriptions Disp Refills   losartan (COZAAR) 50 MG tablet 30 tablet 0    Sig: Take 1 tablet (50 mg total) by mouth daily.     Cardiovascular:  Angiotensin Receptor Blockers Failed - 01/25/2022  9:01 AM      Failed - Cr in normal range and within 180 days    Creatinine, Ser  Date Value Ref Range Status  09/05/2021 1.74 (H) 0.61 - 1.24 mg/dL Final          Failed - Last BP in normal range    BP Readings from Last 1 Encounters:  09/05/21 (!) 135/96          Failed - Valid encounter within last 6 months    Recent Outpatient Visits           9 months ago Essential hypertension   [place], Vernia Buff, NP   1 year ago Dyslipidemia, goal LDL below [place], [place] W, NP   1 year ago Chronic gout with tophus, unspecified cause, unspecified site   [place], [place] W, NP   2 years ago Chronic gout with tophus, unspecified cause, unspecified site   [place], [place] W, NP   2 years ago Chronic gout of multiple sites, unspecified cause   [place], Vernia Buff, NP       Future Appointments             In 1 month Rice, Resa Miner, MD [place]   In 2 months Gildardo Pounds, NP [place] - K in normal range and within 180 days    Potassium  Date Value Ref Range Status  09/05/2021 4.0 3.5 - 5.1 mmol/L  Final          Passed - Patient is not pregnant

## 2022-02-04 ENCOUNTER — Other Ambulatory Visit: Payer: Self-pay | Admitting: Nurse Practitioner

## 2022-02-04 ENCOUNTER — Other Ambulatory Visit: Payer: Self-pay

## 2022-02-04 DIAGNOSIS — I10 Essential (primary) hypertension: Secondary | ICD-10-CM

## 2022-02-04 DIAGNOSIS — N1832 Chronic kidney disease, stage 3b: Secondary | ICD-10-CM

## 2022-02-04 NOTE — Telephone Encounter (Signed)
Requested medication (s) are due for refill today:   Yes  Requested medication (s) are on the active medication list:   Yes  Future visit scheduled:   Yes 03/29/2022 with Zelda   Last ordered: 12/13/2021 #30, 0 refills  Returned because protocol criteria not met.   Labs due and courtesy refill given in Dec. So provider to review for further refills prior to appt.   Requested Prescriptions  Pending Prescriptions Disp Refills   losartan (COZAAR) 50 MG tablet 30 tablet 0    Sig: Take 1 tablet (50 mg total) by mouth daily.     Cardiovascular:  Angiotensin Receptor Blockers Failed - 02/04/2022  9:01 AM      Failed - Cr in normal range and within 180 days    Creatinine, Ser  Date Value Ref Range Status  09/05/2021 1.74 (H) 0.61 - 1.24 mg/dL Final          Failed - Last BP in normal range    BP Readings from Last 1 Encounters:  09/05/21 (!) 135/96          Failed - Valid encounter within last 6 months    Recent Outpatient Visits           9 months ago Essential hypertension   [place], [place] W, NP   1 year ago Dyslipidemia, goal LDL below [place], [place] W, NP   1 year ago Chronic gout with tophus, unspecified cause, unspecified site   [place], [place] W, NP   2 years ago Chronic gout with tophus, unspecified cause, unspecified site   [place], [place] W, NP   2 years ago Chronic gout of multiple sites, unspecified cause   [place], Vernia Buff, NP       Future Appointments             In 1 month Rice, Resa Miner, MD [place]   In 1 month Gildardo Pounds, NP [place] - K in normal range and within 180 days    Potassium  Date Value Ref Range Status  09/05/2021 4.0 3.5 - 5.1 mmol/L Final           Passed - Patient is not pregnant

## 2022-02-05 ENCOUNTER — Other Ambulatory Visit: Payer: Self-pay

## 2022-02-05 MED ORDER — LOSARTAN POTASSIUM 50 MG PO TABS
50.0000 mg | ORAL_TABLET | Freq: Every day | ORAL | 0 refills | Status: DC
  Filled 2022-02-05: qty 30, 30d supply, fill #0

## 2022-02-07 ENCOUNTER — Other Ambulatory Visit: Payer: Self-pay

## 2022-03-06 ENCOUNTER — Other Ambulatory Visit: Payer: Self-pay

## 2022-03-06 ENCOUNTER — Ambulatory Visit (INDEPENDENT_AMBULATORY_CARE_PROVIDER_SITE_OTHER): Payer: Self-pay | Admitting: Internal Medicine

## 2022-03-06 ENCOUNTER — Encounter: Payer: Self-pay | Admitting: Internal Medicine

## 2022-03-06 VITALS — BP 124/85 | HR 103 | Resp 15 | Ht 66.0 in | Wt 189.0 lb

## 2022-03-06 DIAGNOSIS — M1A9XX1 Chronic gout, unspecified, with tophus (tophi): Secondary | ICD-10-CM

## 2022-03-06 DIAGNOSIS — M1A09X1 Idiopathic chronic gout, multiple sites, with tophus (tophi): Secondary | ICD-10-CM

## 2022-03-06 DIAGNOSIS — N1831 Chronic kidney disease, stage 3a: Secondary | ICD-10-CM

## 2022-03-06 MED ORDER — ALLOPURINOL 300 MG PO TABS
ORAL_TABLET | ORAL | 1 refills | Status: DC
  Filled 2022-03-06: qty 60, 30d supply, fill #0
  Filled 2022-04-19: qty 60, 30d supply, fill #1
  Filled 2022-05-13: qty 60, 30d supply, fill #2
  Filled 2022-06-26: qty 60, 30d supply, fill #3

## 2022-03-06 NOTE — Progress Notes (Signed)
? ?Office Visit Note ? ?Patient: Nathan Rubio             ?Date of Birth: 02/22/1971           ?MRN: [id number]             ?PCP: Gildardo Pounds, NP ?Referring: Gildardo Pounds, NP ?Visit Date: 03/06/2022 ? ? ?Subjective:  ?Follow-up (Doing good) ? ? ?History of Present Illness: Nathan Rubio is a 51 y.o. male here for follow up for chronic, polyarticular, tophaceous, erosive gout on allopurinol 600 mg daily. He is taking allopurinol 600 mg daily reports pretty good adherence. He only took the colchicine 1 or 2 occasions usually has not had much pain. He has been drinking water some of the time but also still drinks a lot of soda. He had a recent vacation in [place] back since this weekend so thinks his diet was more liberal than usual recently. ? ?Previous HPI ?09/05/21 ?Nathan Rubio is a 51 y.o. male here for follow up for chronic, polyarticular, tophaceous, erosive gouty arthritis on allopurinol 600 mg daily and colchicine 0.6 mg daily as needed.  Overall since last visit doing reasonably well.  He feels there have been a few minor gout flareups but no very severe episodes.  Most recently involved was left knee pain and stiffness that has improved.  He denies any trouble taking the medication. ?  ?Previous HPI ?02/16/21 ?Nathan Rubio is a 51 y.o. male with HTN, HLD, CKD stage 3 here for evaluation and management of chronic tophaceous gout.  He has gout flares since at least 25 years ago with varying frequency but somewhere between 3-6 times per year most of the time.  More recently he is also had persistent pain and stiffness even between the specific flares.  He was treated with allopurinol in the past but did not stay on this medicine consistently due to lack of medical follow-up. He used to take NSAIDs OTC heavily for symptoms but stopped after being found to have CKD. 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 He was previously seen with Dr. Annabelle Harman at [facility]  rheumatology but not following up due to costs. 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 He has been back on allopurinol not taking 600 mg daily for few months most recent uric acid was at goal in November.  He is taking colchicine as needed for flares which is usually effective within 2 to 3 days. ? ? ?Review of Systems  ?Constitutional:  Negative for fatigue.  ?HENT:  Negative for mouth dryness.   ?Eyes:  Negative for dryness.  ?Respiratory:  Negative for shortness of breath.   ?Cardiovascular:  Negative for swelling in legs/feet.  ?Gastrointestinal:  Negative for constipation.  ?Endocrine: Negative for excessive thirst.  ?Genitourinary:  Negative for difficulty urinating.  ?Musculoskeletal:  Positive for morning stiffness.  ?Skin:  Negative for rash.  ?Allergic/Immunologic: Negative for susceptible to infections.  ?Neurological:  Negative for numbness.  ?Hematological:  Negative for bruising/bleeding tendency.  ?Psychiatric/Behavioral:  Negative for sleep disturbance.   ? ?PMFS History:  ?Patient Active Problem List  ? Diagnosis Date Noted  ? CKD (chronic kidney disease) stage 3, GFR 30-59 ml/min (HCC) 03/20/2020  ? Essential hypertension 03/20/2020  ? Idiopathic chronic gout of multiple sites with tophus 03/20/2020  ?  ?Past Medical History:  ?Diagnosis Date  ? Gout   ?  Inflammatory polyarthritis ([place])   ?  ?Family History  ?Problem Relation Age of Onset  ? Hypertension Sister   ? Depression Neg Hx   ? Diabetes Neg Hx   ? ?History reviewed. No pertinent surgical history. ?Social History  ? ?Social History Narrative  ? Not on file  ? ?Immunization History  ?Administered Date(s) Administered  ? PFIZER(Purple Top)SARS-COV-2 Vaccination 04/14/2020, 05/14/2020, 01/05/2021  ?  ? ?Objective: ?Vital Signs: BP 124/85 (BP Location: Left Arm, Patient Position: Sitting, Cuff Size: Normal)   Pulse (!) 103   Resp 15   Ht 5'  6" (1.676 m)   Wt 189 lb (85.7 kg)   BMI 30.51 kg/m?   ? ?Physical Exam ?Cardiovascular:  ?   Rate and Rhythm: Normal rate and regular rhythm.  ?Pulmonary:  ?   Effort: Pulmonary effort is normal.  ?   Breath sounds: Normal breath sounds.  ?Musculoskeletal:  ?   Right lower leg: No edema.  ?   Left lower leg: No edema.  ?Skin: ?   General: Skin is warm and dry.  ?   Findings: No rash.  ?Neurological:  ?   Mental Status: He is alert.  ?Psychiatric:     ?   Mood and Affect: Mood normal.  ?  ? ?Musculoskeletal Exam:  ?Elbows full ROM no tenderness or swelling, tophi present at olecranon bursa b/l ?Wrist ROM slightly decreased flexion and extension ?Fingers with partially decreased flexion ROM, lateral deviation of MCPs, swan neck deformities more on left than right, no palpable synovitis or tenderness ?Knees full ROM no tenderness or swelling left knee tophus over patella ?Ankles full ROM, nontender fixed nodule on the left achilles tendon ?MTPs slightly decreased ROM no swelling ? ? ?Investigation: ?No additional findings. ? ?Imaging: ?No results found. ? ?Recent Labs: ?Lab Results  ?Component Value Date  ? WBC 8.0 01/15/2021  ? HGB 17.0 01/15/2021  ? PLT 281 01/15/2021  ? NA 142 03/15/2022  ? K 4.1 03/15/2022  ? CL 105 03/15/2022  ? CO2 27 03/15/2022  ? GLUCOSE 79 03/15/2022  ? BUN 22 (H) 03/15/2022  ? CREATININE 1.73 (H) 03/15/2022  ? BILITOT 0.3 01/15/2021  ? ALKPHOS 120 01/15/2021  ? AST 24 01/15/2021  ? ALT 14 01/15/2021  ? PROT 7.8 01/15/2021  ? ALBUMIN 4.8 01/15/2021  ? CALCIUM 10.0 03/15/2022  ? GFRAA 54 (L) 02/16/2021  ? ? ?Speciality Comments: No specialty comments available. ? ?Procedures:  ?No procedures performed ?Allergies: Patient has no known allergies.  ? ?Assessment / Plan:     ?Visit Diagnoses: Idiopathic chronic gout of multiple sites with tophus ?Chronic gout with tophus, unspecified cause, unspecified site - Plan: allopurinol (ZYLOPRIM) 300 MG tablet ? ?Symptoms are pretty well controlled with  only mild pain and no major flare ups. Tophi are not significantly reduced. He is concerned about the high dose of allopurinol long term. I suspect he needs to remain on this, high dose allopurinol is safe even with CKD since he safely titrated up to this dose. Checking uric acid if he is less than 5 can try reducing dose to 1.5 tablets otherwise remain on 600 mg daily.  ? ?Stage 3a chronic kidney disease ([place]) ? ?Checking BMP for any significant change in renal function. ? ?Orders: ?No orders of the defined types were placed in this encounter. ? ?Meds ordered this encounter  ?Medications  ? allopurinol (ZYLOPRIM) 300 MG tablet  ?  Sig: TAKE 2 TABLETS (600 MG TOTAL) BY MOUTH DAILY.  ?  Dispense:  180 tablet  ?  Refill:  1  ? ? ? ?Follow-Up Instructions: Return in about 6 months (around 09/06/2022) for Gout on allopurinol 600 mg f/u 84mos. ? ? ?Collier Salina, MD ? ?Note - This record has been created using [employer].  ?Chart creation errors have been sought, but may not always  ?have been located. Such creation errors do not reflect on  ?the standard of medical care. ? ?

## 2022-03-11 ENCOUNTER — Other Ambulatory Visit: Payer: Self-pay | Admitting: Family Medicine

## 2022-03-11 ENCOUNTER — Other Ambulatory Visit: Payer: Self-pay

## 2022-03-11 ENCOUNTER — Ambulatory Visit: Payer: No Typology Code available for payment source | Attending: Nurse Practitioner

## 2022-03-11 DIAGNOSIS — N1832 Chronic kidney disease, stage 3b: Secondary | ICD-10-CM

## 2022-03-11 DIAGNOSIS — I10 Essential (primary) hypertension: Secondary | ICD-10-CM

## 2022-03-12 NOTE — Telephone Encounter (Signed)
Requested medication (s) are due for refill today: yes ? ?Requested medication (s) are on the active medication list: yes ? ?Last refill:  02/05/22-05/06/22 #30 0 refills ? ?Future visit scheduled: yes in 3 weeks ? ?Notes to clinic:  unable to refill per protocol. Last labs 09/05/21. Do you want to refill Rx? [id number] ? ? ?  ?Requested Prescriptions  ?Pending Prescriptions Disp Refills  ? losartan (COZAAR) 50 MG tablet 30 tablet 0  ?  Sig: Take 1 tablet (50 mg total) by mouth daily.  ?  ? Cardiovascular:  Angiotensin Receptor Blockers Failed - 03/11/2022  8:35 AM  ?  ?  Failed - Cr in normal range and within 180 days  ?  Creatinine, Ser  ?Date Value Ref Range Status  ?09/05/2021 1.74 (H) 0.61 - 1.24 mg/dL Final  ?  ?  ?  ?  Failed - K in normal range and within 180 days  ?  Potassium  ?Date Value Ref Range Status  ?09/05/2021 4.0 3.5 - 5.1 mmol/L Final  ?  ?  ?  ?  Failed - Valid encounter within last 6 months  ?  Recent Outpatient Visits   ? ?      ? 10 months ago Essential hypertension  ? [facility] [place], [place] W, NP  ? 1 year ago Dyslipidemia, goal LDL below 100  ? [facility] [place], [place] W, NP  ? 1 year ago Chronic gout with tophus, unspecified cause, unspecified site  ? [facility] [place], [place] W, NP  ? 2 years ago Chronic gout with tophus, unspecified cause, unspecified site  ? [facility] [place], [place] W, NP  ? 3 years ago Chronic gout of multiple sites, unspecified cause  ? [facility] Claiborne Rigg, NP  ? ?  ?  ?Future Appointments   ? ?        ? In 3 weeks Claiborne Rigg, NP [facility]  ? In 5 months Rice, Jamesetta Orleans, MD [facility]  ? ?  ? ?  ?  ?  Passed - Patient is not pregnant  ?  ?  Passed - Last BP in normal range  ?  BP Readings from Last 1 Encounters:  ?03/06/22 124/85  ?  ?  ?  ?  ? ?

## 2022-03-13 ENCOUNTER — Other Ambulatory Visit: Payer: Self-pay

## 2022-03-13 MED ORDER — LOSARTAN POTASSIUM 50 MG PO TABS
50.0000 mg | ORAL_TABLET | Freq: Every day | ORAL | 0 refills | Status: DC
  Filled 2022-03-13: qty 30, 30d supply, fill #0

## 2022-03-14 ENCOUNTER — Other Ambulatory Visit: Payer: Self-pay

## 2022-03-15 ENCOUNTER — Other Ambulatory Visit (HOSPITAL_COMMUNITY)
Admission: RE | Admit: 2022-03-15 | Discharge: 2022-03-15 | Disposition: A | Discharge status: Home / Self Care | Payer: No Typology Code available for payment source | Source: Ambulatory Visit | Attending: Internal Medicine | Admitting: Internal Medicine

## 2022-03-15 DIAGNOSIS — M1A9XX1 Chronic gout, unspecified, with tophus (tophi): Secondary | ICD-10-CM | POA: Insufficient documentation

## 2022-03-15 DIAGNOSIS — N1832 Chronic kidney disease, stage 3b: Secondary | ICD-10-CM | POA: Insufficient documentation

## 2022-03-15 DIAGNOSIS — I129 Hypertensive chronic kidney disease with stage 1 through stage 4 chronic kidney disease, or unspecified chronic kidney disease: Secondary | ICD-10-CM | POA: Insufficient documentation

## 2022-03-15 LAB — BASIC METABOLIC PANEL
Anion gap: 10 (ref 5–15)
BUN: 22 mg/dL — ABNORMAL HIGH (ref 6–20)
CO2: 27 mmol/L (ref 22–32)
Calcium: 10 mg/dL (ref 8.9–10.3)
Chloride: 105 mmol/L (ref 98–111)
Creatinine, Ser: 1.73 mg/dL — ABNORMAL HIGH (ref 0.61–1.24)
GFR, Estimated: 47 mL/min — ABNORMAL LOW (ref >60)
Glucose, Bld: 79 mg/dL (ref 70–99)
Potassium: 4.1 mmol/L (ref 3.5–5.1)
Sodium: 142 mmol/L (ref 135–145)

## 2022-03-15 LAB — URIC ACID: Uric Acid, Serum: 6.1 mg/dL (ref 3.7–8.6)

## 2022-03-29 ENCOUNTER — Ambulatory Visit: Payer: No Typology Code available for payment source | Admitting: Nurse Practitioner

## 2022-04-05 ENCOUNTER — Other Ambulatory Visit: Payer: Self-pay

## 2022-04-05 ENCOUNTER — Ambulatory Visit: Payer: Self-pay | Attending: Nurse Practitioner | Admitting: Nurse Practitioner

## 2022-04-05 ENCOUNTER — Encounter: Payer: Self-pay | Admitting: Nurse Practitioner

## 2022-04-05 VITALS — BP 135/99 | HR 77 | Wt 188.0 lb

## 2022-04-05 DIAGNOSIS — I10 Essential (primary) hypertension: Secondary | ICD-10-CM

## 2022-04-05 DIAGNOSIS — M1A9XX1 Chronic gout, unspecified, with tophus (tophi): Secondary | ICD-10-CM

## 2022-04-05 DIAGNOSIS — N1832 Chronic kidney disease, stage 3b: Secondary | ICD-10-CM

## 2022-04-05 MED ORDER — COLCHICINE 0.6 MG PO TABS
0.6000 mg | ORAL_TABLET | Freq: Every day | ORAL | 1 refills | Status: DC | PRN
  Filled 2022-04-05: qty 20, 20d supply, fill #0

## 2022-04-05 MED ORDER — LOSARTAN POTASSIUM 50 MG PO TABS
50.0000 mg | ORAL_TABLET | Freq: Every day | ORAL | 1 refills | Status: DC
  Filled 2022-04-05: qty 30, 30d supply, fill #0
  Filled 2022-05-13: qty 30, 30d supply, fill #1
  Filled 2022-06-03: qty 30, 30d supply, fill #2

## 2022-04-05 NOTE — Progress Notes (Addendum)
? ?Assessment & Plan:  ?Nathan Rubio was seen today for hypertension. ? ?Diagnoses and all orders for this visit: ? ?Essential hypertension ?-     losartan (COZAAR) 50 MG tablet; Take 1 tablet (50 mg total) by mouth daily. ?We will follow-up with home blood pressure readings in 2 weeks. ? ?Chronic gout with tophus, unspecified cause, unspecified site ?-     colchicine 0.6 MG tablet; Take 1 tablet (0.6 mg total) by mouth daily as needed (for gout flare). ?Continue close follow-up with rheumatology ? ? ?Stage 3b chronic kidney disease (HCC) ?-     losartan (COZAAR) 50 MG tablet; Take 1 tablet (50 mg total) by mouth daily. ?-     CBC ?DASH DIET ?Increase water intake ?Will obtain urine sample ? ? ? ?Patient has been counseled on age-appropriate routine health concerns for screening and prevention. These are reviewed and up-to-date. Referrals have been placed accordingly. Immunizations are up-to-date or declined.    ?Subjective:  ? ?Chief Complaint  ?Patient presents with  ? Hypertension  ? ?HPI ?Nathan Rubio 51 y.o. male presents to office today for follow-up to hypertension. ?He has a past medical history of Gout and Inflammatory polyarthritis (followed by rheumatology) ? ?HTN ?Blood pressure is not at goal.  He does endorse adherence taking losartan 50 mg daily.  He is not a smoker.  Diet is not optimal in regard to high processed foods. ?BP Readings from Last 3 Encounters:  ?04/05/22 (!) 135/99  ?03/06/22 124/85  ?09/05/21 (!) 135/96  ?  ? ?Review of Systems  ?Constitutional:  Negative for fever, malaise/fatigue and weight loss.  ?HENT: Negative.  Negative for nosebleeds.   ?Eyes: Negative.  Negative for blurred vision, double vision and photophobia.  ?Respiratory: Negative.  Negative for cough and shortness of breath.   ?Cardiovascular: Negative.  Negative for chest pain, palpitations and leg swelling.  ?Gastrointestinal: Negative.  Negative for heartburn, nausea and vomiting.  ?Musculoskeletal: Negative.  Negative  for myalgias.  ?Neurological: Negative.  Negative for dizziness, focal weakness, seizures and headaches.  ?Psychiatric/Behavioral: Negative.  Negative for suicidal ideas.   ? ?Past Medical History:  ?Diagnosis Date  ? Gout   ? Inflammatory polyarthritis ([place])   ? ? ?No past surgical history on file. ? ?Family History  ?Problem Relation Age of Onset  ? Hypertension Sister   ? Depression Neg Hx   ? Diabetes Neg Hx   ? ? ?Social History Reviewed with no changes to be made today.  ? ?Outpatient Medications Prior to Visit  ?Medication Sig Dispense Refill  ? allopurinol (ZYLOPRIM) 300 MG tablet TAKE 2 TABLETS (600 MG TOTAL) BY MOUTH DAILY. 180 tablet 1  ? colchicine 0.6 MG tablet Take 1 tablet (0.6 mg total) by mouth daily as needed (for gout flare). 20 tablet 1  ? losartan (COZAAR) 50 MG tablet Take 1 tablet (50 mg total) by mouth daily. 30 tablet 0  ? ?No facility-administered medications prior to visit.  ? ? ?No Known Allergies ? ?   ?Objective:  ?  ?BP (!) 135/99   Pulse 77   Wt 188 lb (85.3 kg)   SpO2 96%   BMI 30.34 kg/m?  ?Wt Readings from Last 3 Encounters:  ?04/05/22 188 lb (85.3 kg)  ?03/06/22 189 lb (85.7 kg)  ?09/05/21 187 lb (84.8 kg)  ? ? ?Physical Exam ?Vitals and nursing note reviewed.  ?Constitutional:   ?   Appearance: He is well-developed.  ?HENT:  ?   Head: Normocephalic and atraumatic.  ?  Cardiovascular:  ?   Rate and Rhythm: Normal rate and regular rhythm.  ?   Heart sounds: Normal heart sounds. No murmur heard. ?  No friction rub. No gallop.  ?Pulmonary:  ?   Effort: Pulmonary effort is normal. No tachypnea or respiratory distress.  ?   Breath sounds: Normal breath sounds. No decreased breath sounds, wheezing, rhonchi or rales.  ?Chest:  ?   Chest wall: No tenderness.  ?Abdominal:  ?   General: Bowel sounds are normal.  ?   Palpations: Abdomen is soft.  ?Musculoskeletal:     ?   General: Normal range of motion.  ?   Cervical back: Normal range of motion.  ?Skin: ?   General: Skin is warm and dry.   ?Neurological:  ?   Mental Status: He is alert and oriented to person, place, and time.  ?   Coordination: Coordination normal.  ?Psychiatric:     ?   Behavior: Behavior normal. Behavior is cooperative.     ?   Thought Content: Thought content normal.     ?   Judgment: Judgment normal.  ? ? ? ? ?   ?Patient has been counseled extensively about nutrition and exercise as well as the importance of adherence with medications and regular follow-up. The patient was given clear instructions to go to ER or return to medical center if symptoms don't improve, worsen or new problems develop. The patient verbalized understanding.  ? ?Follow-up: Return in about 11 days (around 04/16/2022) for VIRTUAL ON TUESDAY on 04-16-2022 BP CHECK,  then see me in 3 months.  ? ?Gildardo Pounds, FNP-BC ?[place] ?[place], [place] ?[phone]   ?04/05/2022, 10:02 AM ?

## 2022-04-06 LAB — CBC
Hematocrit: 51.4 % — ABNORMAL HIGH (ref 37.5–51.0)
Hemoglobin: 17.9 g/dL — ABNORMAL HIGH (ref 13.0–17.7)
MCH: 31.5 pg (ref 26.6–33.0)
MCHC: 34.8 g/dL (ref 31.5–35.7)
MCV: 90 fL (ref 79–97)
Platelets: 235 10*3/uL (ref 150–450)
RBC: 5.69 x10E6/uL (ref 4.14–5.80)
RDW: 12.8 % (ref 11.6–15.4)
WBC: 7.6 10*3/uL (ref 3.4–10.8)

## 2022-04-11 ENCOUNTER — Other Ambulatory Visit: Payer: Self-pay

## 2022-04-16 ENCOUNTER — Encounter: Payer: Self-pay | Admitting: Nurse Practitioner

## 2022-04-16 ENCOUNTER — Ambulatory Visit: Payer: Self-pay | Attending: Nurse Practitioner | Admitting: Nurse Practitioner

## 2022-04-16 DIAGNOSIS — R7989 Other specified abnormal findings of blood chemistry: Secondary | ICD-10-CM

## 2022-04-16 DIAGNOSIS — I10 Essential (primary) hypertension: Secondary | ICD-10-CM

## 2022-04-16 NOTE — Progress Notes (Signed)
Virtual Visit via Telephone Note ? I discussed the limitations, risks, security and privacy concerns of performing an evaluation and management service by telephone and the availability of in person appointments. I also discussed with the patient that there may be a patient responsible charge related to this service. The patient expressed understanding and agreed to proceed.  ? ? ?I connected with Nathan Rubio on 04/16/22  at   2:10 PM EDT  EDT by telephone and verified that I am speaking with the correct person using two identifiers. ? ?Location of Patient: ?Private Residence ?  ?Location of Provider: ?[facility] [employer] [facility]  ?  ?Persons participating in Telemedicine visit: ?[name] FNP-BC ?Nathan Rubio  ?  ?History of Present Illness: ?Telemedicine visit for: HTN ? ?Notes home BP readings 120-130/90-100s. He is currently taking losartan 50 mg daily as prescribed. Will increase to 1.5 tablets today for better blood pressure control. He will notify me via mychart for persistent uncontrolled blood pressure readings after increase in dosage.  ?BP Readings from Last 3 Encounters:  ?04/05/22 (!) 135/99  ?03/06/22 124/85  ?09/05/21 (!) 135/96  ?  ? ? ? ?Past Medical History:  ?Diagnosis Date  ? Gout   ? Inflammatory polyarthritis (HCC)   ?  ?History reviewed. No pertinent surgical history.  ?Family History  ?Problem Relation Age of Onset  ? Hypertension Sister   ? Depression Neg Hx   ? Diabetes Neg Hx   ?  ?Social History  ? ?Socioeconomic History  ? Marital status: Single  ?  Spouse name: Not on file  ? Number of children: Not on file  ? Years of education: Not on file  ? Highest education level: Not on file  ?Occupational History  ? Not on file  ?Tobacco Use  ? Smoking status: Never  ? Smokeless tobacco: Never  ?Vaping Use  ? Vaping Use: Never used  ?Substance and Sexual Activity  ? Alcohol use: Not Currently  ? Drug use: Never  ? Sexual activity: Not Currently  ?Other Topics Concern   ? Not on file  ?Social History Narrative  ? Not on file  ? ?Social Determinants of Health  ? ?Financial Resource Strain: Not on file  ?Food Insecurity: Not on file  ?Transportation Needs: Not on file  ?Physical Activity: Not on file  ?Stress: Not on file  ?Social Connections: Not on file  ?  ? ?Observations/Objective: ?Awake, alert and oriented x 3 ? ? ?Review of Systems  ?Constitutional:  Negative for fever, malaise/fatigue and weight loss.  ?HENT: Negative.  Negative for nosebleeds.   ?Eyes: Negative.  Negative for blurred vision, double vision and photophobia.  ?Respiratory: Negative.  Negative for cough and shortness of breath.   ?Cardiovascular: Negative.  Negative for chest pain, palpitations and leg swelling.  ?Gastrointestinal: Negative.  Negative for heartburn, nausea and vomiting.  ?Musculoskeletal: Negative.  Negative for myalgias.  ?Neurological: Negative.  Negative for dizziness, focal weakness, seizures and headaches.  ?Psychiatric/Behavioral: Negative.  Negative for suicidal ideas.    ?Assessment and Plan: ?Diagnoses and all orders for this visit: ? ?Essential hypertension ?Continue losartan 75mg  as prescribed.  ?Remember to bring in your blood pressure log with you for your follow up appointment.  ?DASH/Mediterranean Diets are healthier choices for HTN.   ? ?Elevated serum creatinine ?-     Urinalysis, Complete; Future ?-     Basic metabolic panel; Future ? ?  ? ?Follow Up Instructions ?Return in about 3 months (around 07/16/2022).  ? ?  ?  I discussed the assessment and treatment plan with the patient. The patient was provided an opportunity to ask questions and all were answered. The patient agreed with the plan and demonstrated an understanding of the instructions. ?  ?The patient was advised to call back or seek an in-person evaluation if the symptoms worsen or if the condition fails to improve as anticipated. ? ?I provided 10 minutes of non-face-to-face time during this encounter including median  intraservice time, reviewing previous notes, labs, imaging, medications and explaining diagnosis and management. ? ?Claiborne Rigg, FNP-BC  ?

## 2022-04-19 ENCOUNTER — Other Ambulatory Visit: Payer: Self-pay

## 2022-04-26 ENCOUNTER — Ambulatory Visit: Payer: No Typology Code available for payment source | Attending: Nurse Practitioner

## 2022-04-26 DIAGNOSIS — R7989 Other specified abnormal findings of blood chemistry: Secondary | ICD-10-CM

## 2022-04-27 LAB — BASIC METABOLIC PANEL
BUN/Creatinine Ratio: 17 (ref 9–20)
BUN: 28 mg/dL — ABNORMAL HIGH (ref 6–24)
CO2: 22 mmol/L (ref 20–29)
Calcium: 10.1 mg/dL (ref 8.7–10.2)
Chloride: 100 mmol/L (ref 96–106)
Creatinine, Ser: 1.64 mg/dL — ABNORMAL HIGH (ref 0.76–1.27)
Glucose: 90 mg/dL (ref 70–99)
Potassium: 4.9 mmol/L (ref 3.5–5.2)
Sodium: 138 mmol/L (ref 134–144)
eGFR: 51 mL/min/{1.73_m2} — ABNORMAL LOW (ref >59)

## 2022-04-27 LAB — URINALYSIS, COMPLETE
Bilirubin, UA: NEGATIVE
Glucose, UA: NEGATIVE
Ketones, UA: NEGATIVE
Leukocytes,UA: NEGATIVE
Nitrite, UA: NEGATIVE
Specific Gravity, UA: 1.014 (ref 1.005–1.030)
Urobilinogen, Ur: 0.2 mg/dL (ref 0.2–1.0)
pH, UA: 6.5 (ref 5.0–7.5)

## 2022-04-27 LAB — MICROSCOPIC EXAMINATION
Bacteria, UA: NONE SEEN
Casts: NONE SEEN /lpf
Epithelial Cells (non renal): NONE SEEN /hpf (ref 0–10)
WBC, UA: NONE SEEN /hpf (ref 0–5)

## 2022-05-14 ENCOUNTER — Other Ambulatory Visit: Payer: Self-pay

## 2022-06-04 ENCOUNTER — Other Ambulatory Visit: Payer: Self-pay

## 2022-06-04 ENCOUNTER — Other Ambulatory Visit: Payer: Self-pay | Admitting: Pharmacist

## 2022-06-04 DIAGNOSIS — I10 Essential (primary) hypertension: Secondary | ICD-10-CM

## 2022-06-04 DIAGNOSIS — N1832 Chronic kidney disease, stage 3b: Secondary | ICD-10-CM

## 2022-06-04 MED ORDER — LOSARTAN POTASSIUM 50 MG PO TABS
75.0000 mg | ORAL_TABLET | Freq: Every day | ORAL | 2 refills | Status: DC
  Filled 2022-06-04 – 2022-07-05 (×2): qty 45, 30d supply, fill #0

## 2022-06-26 ENCOUNTER — Other Ambulatory Visit: Payer: Self-pay

## 2022-06-28 ENCOUNTER — Other Ambulatory Visit: Payer: Self-pay

## 2022-07-05 ENCOUNTER — Ambulatory Visit: Payer: Self-pay | Attending: Nurse Practitioner | Admitting: Nurse Practitioner

## 2022-07-05 ENCOUNTER — Encounter: Payer: Self-pay | Admitting: Nurse Practitioner

## 2022-07-05 ENCOUNTER — Other Ambulatory Visit: Payer: Self-pay

## 2022-07-05 VITALS — BP 130/87 | HR 87 | Temp 97.9°F | Ht 66.0 in | Wt 187.2 lb

## 2022-07-05 DIAGNOSIS — Z1211 Encounter for screening for malignant neoplasm of colon: Secondary | ICD-10-CM

## 2022-07-05 DIAGNOSIS — I10 Essential (primary) hypertension: Secondary | ICD-10-CM

## 2022-07-05 DIAGNOSIS — N1832 Chronic kidney disease, stage 3b: Secondary | ICD-10-CM

## 2022-07-05 DIAGNOSIS — M1A9XX1 Chronic gout, unspecified, with tophus (tophi): Secondary | ICD-10-CM

## 2022-07-05 MED ORDER — COLCHICINE 0.6 MG PO TABS
0.6000 mg | ORAL_TABLET | Freq: Every day | ORAL | 1 refills | Status: AC | PRN
  Filled 2022-07-05: qty 20, 20d supply, fill #0

## 2022-07-05 MED ORDER — ALLOPURINOL 300 MG PO TABS
ORAL_TABLET | ORAL | 1 refills | Status: DC
  Filled 2022-07-05: qty 180, fill #0
  Filled 2022-07-30: qty 180, 90d supply, fill #0
  Filled 2022-11-15: qty 180, 90d supply, fill #1
  Filled 2022-11-15: qty 60, 30d supply, fill #1
  Filled 2022-12-20 (×2): qty 60, 30d supply, fill #2
  Filled 2023-01-30 (×2): qty 60, 30d supply, fill #3

## 2022-07-05 MED ORDER — LOSARTAN POTASSIUM 100 MG PO TABS
100.0000 mg | ORAL_TABLET | Freq: Every day | ORAL | 1 refills | Status: DC
  Filled 2022-07-05: qty 90, 90d supply, fill #0
  Filled 2022-10-15: qty 90, 90d supply, fill #1

## 2022-07-05 NOTE — Progress Notes (Addendum)
Assessment & Plan:  Nathan Rubio was seen today for hypertension.  Diagnoses and all orders for this visit:  Essential hypertension -     losartan (COZAAR) 100 MG tablet; Take 1 tablet (100 mg total) by mouth daily.  Stage 3b chronic kidney disease (HCC) -     losartan (COZAAR) 100 MG tablet; Take 1 tablet (100 mg total) by mouth daily.  Chronic gout with tophus, unspecified cause, unspecified site -     colchicine 0.6 MG tablet; Take 1 tablet (0.6 mg total) by mouth daily as needed (for gout flare). -     allopurinol (ZYLOPRIM) 300 MG tablet; TAKE 2 TABLETS (600 MG TOTAL) BY MOUTH DAILY.  Colon cancer screening -     Fecal occult blood, imunochemical    Patient has been counseled on age-appropriate routine health concerns for screening and prevention. These are reviewed and up-to-date. Referrals have been placed accordingly. Immunizations are up-to-date or declined.    Subjective:   Chief Complaint  Patient presents with   Hypertension    Nathan Rubio 51 y.o. male presents to office today for follow up to HTN He is doing well today. Denies any gout flare symptoms   He has a past medical history of Gout, HTN, CKD and chronic, polyarticular, tophaceous, erosive gout  (followed by rheumatology)  Blood pressure is still not at goal. Will increase losartan to 100 mg. He does state his blood pressure has been running high at home with losartan 75 mg. He is trying to lose weight and incorporate healthy dietary modifications.  BP Readings from Last 3 Encounters:  07/05/22 130/87  04/05/22 (!) 135/99  03/06/22 124/85      Review of Systems  Constitutional:  Negative for fever, malaise/fatigue and weight loss.  HENT: Negative.  Negative for nosebleeds.   Eyes: Negative.  Negative for blurred vision, double vision and photophobia.  Respiratory: Negative.  Negative for cough and shortness of breath.   Cardiovascular: Negative.  Negative for chest pain, palpitations and leg  swelling.  Gastrointestinal: Negative.  Negative for heartburn, nausea and vomiting.  Musculoskeletal: Negative.  Negative for myalgias.  Neurological: Negative.  Negative for dizziness, focal weakness, seizures and headaches.  Psychiatric/Behavioral: Negative.  Negative for suicidal ideas.     Past Medical History:  Diagnosis Date   Gout    Inflammatory polyarthritis (HCC)     History reviewed. No pertinent surgical history.  Family History  Problem Relation Age of Onset   Hypertension Sister    Depression Neg Hx    Diabetes Neg Hx     Social History Reviewed with no changes to be made today.   Outpatient Medications Prior to Visit  Medication Sig Dispense Refill   losartan (COZAAR) 50 MG tablet Take 1.5 tablets (75 mg total) by mouth once daily. 45 tablet 2   allopurinol (ZYLOPRIM) 300 MG tablet TAKE 2 TABLETS (600 MG TOTAL) BY MOUTH DAILY. 180 tablet 1   colchicine 0.6 MG tablet Take 1 tablet (0.6 mg total) by mouth daily as needed (for gout flare). 20 tablet 1   No facility-administered medications prior to visit.    No Known Allergies     Objective:    BP 130/87   Pulse 87   Temp 97.9 F (36.6 C) (Oral)   Ht 5\' 6"  (1.676 m)   Wt 187 lb 3.2 oz (84.9 kg)   SpO2 99%   BMI 30.21 kg/m  Wt Readings from Last 3 Encounters:  07/05/22 187 lb 3.2  oz (84.9 kg)  04/05/22 188 lb (85.3 kg)  03/06/22 189 lb (85.7 kg)    Physical Exam Vitals and nursing note reviewed.  Constitutional:      Appearance: He is well-developed.  HENT:     Head: Normocephalic and atraumatic.  Cardiovascular:     Rate and Rhythm: Normal rate and regular rhythm.     Heart sounds: Normal heart sounds. No murmur heard.    No friction rub. No gallop.  Pulmonary:     Effort: Pulmonary effort is normal. No tachypnea or respiratory distress.     Breath sounds: Normal breath sounds. No decreased breath sounds, wheezing, rhonchi or rales.  Chest:     Chest wall: No tenderness.  Abdominal:      General: Bowel sounds are normal.     Palpations: Abdomen is soft.  Musculoskeletal:        General: Normal range of motion.     Cervical back: Normal range of motion.  Skin:    General: Skin is warm and dry.  Neurological:     Mental Status: He is alert and oriented to person, place, and time.     Coordination: Coordination normal.  Psychiatric:        Behavior: Behavior normal. Behavior is cooperative.        Thought Content: Thought content normal.        Judgment: Judgment normal.          Patient has been counseled extensively about nutrition and exercise as well as the importance of adherence with medications and regular follow-up. The patient was given clear instructions to go to ER or return to medical center if symptoms don't improve, worsen or new problems develop. The patient verbalized understanding.   Follow-up: Return for virtual on a tuesday for BP CHECK in 2 weeks with me. THEN Lab appt in 4 weeks. Claiborne Rigg, FNP-BC [facility] [place], [place] [phone]   07/23/2022, 9:30 AM

## 2022-07-11 ENCOUNTER — Other Ambulatory Visit: Payer: Self-pay

## 2022-07-23 ENCOUNTER — Other Ambulatory Visit: Payer: Self-pay | Admitting: Nurse Practitioner

## 2022-07-23 ENCOUNTER — Other Ambulatory Visit: Payer: Self-pay

## 2022-07-23 ENCOUNTER — Telehealth: Payer: No Typology Code available for payment source | Admitting: Nurse Practitioner

## 2022-07-23 DIAGNOSIS — I10 Essential (primary) hypertension: Secondary | ICD-10-CM

## 2022-07-23 MED ORDER — AMLODIPINE BESYLATE 10 MG PO TABS
10.0000 mg | ORAL_TABLET | Freq: Every day | ORAL | 1 refills | Status: DC
  Filled 2022-07-23: qty 30, 30d supply, fill #0
  Filled 2022-08-22: qty 30, 30d supply, fill #1
  Filled 2022-09-18: qty 30, 30d supply, fill #2
  Filled 2022-10-15: qty 30, 30d supply, fill #3
  Filled 2022-11-15: qty 30, 30d supply, fill #4
  Filled 2022-12-13: qty 30, 30d supply, fill #5

## 2022-07-23 NOTE — Progress Notes (Unsigned)
Virtual Visit via Telephone Note  I discussed the limitations, risks, security and privacy concerns of performing an evaluation and management service by telephone and the availability of in person appointments. I also discussed with the patient that there may be a patient responsible charge related to this service. The patient expressed understanding and agreed to proceed.    I connected with Nathan Rubio on 07/23/22  at    EDT by telephone and verified that I am speaking with the correct person using two identifiers.  Location of Patient: Private Residence   [facility] of Provider: [facility] [employer] [facility]    Persons participating in Telemedicine visit: Nathan Denver FNP-BC Nathan Rubio    History of Present Illness: Telemedicine visit for: ***    Past Medical History:  Diagnosis Date   Gout    Inflammatory polyarthritis (HCC)     No past surgical history on file.  Family History  Problem Relation Age of Onset   Hypertension Sister    Depression Neg Hx    Diabetes Neg Hx     Social History   Socioeconomic History   Marital status: Single    Spouse name: Not on file   Number of children: Not on file   Years of education: Not on file   Highest education level: Not on file  Occupational History   Not on file  Tobacco Use   Smoking status: Never   Smokeless tobacco: Never  Vaping Use   Vaping Use: Never used  Substance and Sexual Activity   Alcohol use: Not Currently   Drug use: Never   Sexual activity: Not Currently  Other Topics Concern   Not on file  Social History Narrative   Not on file   Social Determinants of Health   Financial Resource Strain: Not on file  Food Insecurity: Not on file  Transportation Needs: Not on file  Physical Activity: Not on file  Stress: Not on file  Social Connections: Not on file     Observations/Objective: Awake, alert and oriented x 3   ROS  Assessment and Plan: Diagnoses and all orders  for this visit:  Essential hypertension -     amLODipine (NORVASC) 10 MG tablet; Take 1 tablet (10 mg total) by mouth daily.     Follow Up Instructions No follow-ups on file.     I discussed the assessment and treatment plan with the patient. The patient was provided an opportunity to ask questions and all were answered. The patient agreed with the plan and demonstrated an understanding of the instructions.   The patient was advised to call back or seek an in-person evaluation if the symptoms worsen or if the condition fails to improve as anticipated.  I provided *** minutes of non-face-to-face time during this encounter including median intraservice time, reviewing previous notes, labs, imaging, medications and explaining diagnosis and management.  Claiborne Rigg, FNP-BC

## 2022-07-31 ENCOUNTER — Other Ambulatory Visit: Payer: Self-pay

## 2022-08-02 ENCOUNTER — Ambulatory Visit: Payer: Self-pay | Attending: Nurse Practitioner

## 2022-08-02 DIAGNOSIS — I10 Essential (primary) hypertension: Secondary | ICD-10-CM

## 2022-08-03 ENCOUNTER — Other Ambulatory Visit: Payer: Self-pay | Admitting: Nurse Practitioner

## 2022-08-03 DIAGNOSIS — R801 Persistent proteinuria, unspecified: Secondary | ICD-10-CM

## 2022-08-03 LAB — CMP14+EGFR
ALT: 25 IU/L (ref 0–44)
AST: 38 IU/L (ref 0–40)
Albumin/Globulin Ratio: 1.9 (ref 1.2–2.2)
Albumin: 5 g/dL (ref 4.1–5.1)
Alkaline Phosphatase: 102 IU/L (ref 44–121)
BUN/Creatinine Ratio: 17 (ref 9–20)
BUN: 29 mg/dL — ABNORMAL HIGH (ref 6–24)
Bilirubin Total: 0.4 mg/dL (ref 0.0–1.2)
CO2: 20 mmol/L (ref 20–29)
Calcium: 10 mg/dL (ref 8.7–10.2)
Chloride: 104 mmol/L (ref 96–106)
Creatinine, Ser: 1.72 mg/dL — ABNORMAL HIGH (ref 0.76–1.27)
Globulin, Total: 2.6 g/dL (ref 1.5–4.5)
Glucose: 95 mg/dL (ref 70–99)
Potassium: 4.5 mmol/L (ref 3.5–5.2)
Sodium: 141 mmol/L (ref 134–144)
Total Protein: 7.6 g/dL (ref 6.0–8.5)
eGFR: 48 mL/min/{1.73_m2} — ABNORMAL LOW (ref >59)

## 2022-08-03 LAB — FECAL OCCULT BLOOD, IMMUNOCHEMICAL: Fecal Occult Bld: NEGATIVE

## 2022-08-23 ENCOUNTER — Other Ambulatory Visit: Payer: Self-pay

## 2022-08-29 NOTE — Progress Notes (Unsigned)
Office Visit Note  Patient: Nathan Rubio             Date of Birth: [birth year]           MRN: [id number]             PCP: Claiborne Rigg, NP Referring: Claiborne Rigg, NP Visit Date: 09/06/2022   Subjective:  Follow-up (Patient wondering about allopurinol affecting kidney function. )   History of Present Illness: Nathan Rubio is a 51 y.o. male here for follow up for chronic, polyarticular, tophaceous, erosive gout  on allopurinol 600 mg daily. He had no major flare since our last visit. He took aleve for minor pain in the right knee recently but just once dose. He had a decrease in renal function on most recent labs concerning for CKD progression or medication side effect. This was a month after increase in losartan dose and addition of allopurinol for incompletely controlled hypertension.  Previous HPI 03/06/2022 Nathan Rubio is a 51 y.o. male here for follow up for chronic, polyarticular, tophaceous, erosive gout on allopurinol 600 mg daily. He is taking allopurinol 600 mg daily reports pretty good adherence. He only took the colchicine 1 or 2 occasions usually has not had much pain. He has been drinking water some of the time but also still drinks a lot of soda. He had a recent vacation in [place] back since this weekend so thinks his diet was more liberal than usual recently.   Previous HPI 09/05/21 Nathan Rubio is a 51 y.o. male here for follow up for chronic, polyarticular, tophaceous, erosive gouty arthritis on allopurinol 600 mg daily and colchicine 0.6 mg daily as needed.  Overall since last visit doing reasonably well.  He feels there have been a few minor gout flareups but no very severe episodes.  Most recently involved was left knee pain and stiffness that has improved.  He denies any trouble taking the medication.   Previous HPI 02/16/21 Nathan Rubio is a 51 y.o. male with HTN, HLD, CKD stage 3 here for evaluation and management of chronic tophaceous gout.  He  has gout flares since at least 25 years ago with varying frequency but somewhere between 3-6 times per year most of the time.  More recently he is also had persistent pain and stiffness even between the specific flares.  He was treated with allopurinol in the past but did not stay on this medicine consistently due to lack of medical follow-up. He used to take NSAIDs OTC heavily for symptoms but stopped after being found to have CKD. 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 He was previously seen with Dr. Nydia Bouton at [facility] rheumatology but not following up due to costs. 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 He has been back on allopurinol not taking 600 mg daily for few months most recent uric acid was at goal in November.  He is taking colchicine as needed for flares which is usually effective within 2 to 3 days.     Review of Systems  Constitutional:  Negative for fatigue.  HENT:  Negative for mouth sores and mouth dryness.   Eyes:  Negative for dryness.  Respiratory:  Negative for shortness of breath.   Cardiovascular:  Negative for chest pain and palpitations.  Gastrointestinal:  Negative for blood in stool, constipation and diarrhea.  Endocrine: Negative for increased  urination.  Genitourinary:  Negative for involuntary urination.  Musculoskeletal:  Negative for joint pain, gait problem, joint pain, joint swelling, myalgias, muscle weakness, morning stiffness, muscle tenderness and myalgias.  Skin:  Negative for color change, rash, hair loss and sensitivity to sunlight.  Allergic/Immunologic: Negative for susceptible to infections.  Neurological:  Negative for dizziness and headaches.  Hematological:  Negative for swollen glands.  Psychiatric/Behavioral:  Negative for depressed mood and sleep disturbance. The patient is not nervous/anxious.     PMFS  History:  Patient Active Problem List   Diagnosis Date Noted   CKD (chronic kidney disease) stage 3, GFR 30-59 ml/min (HCC) 03/20/2020   Essential hypertension 03/20/2020   Idiopathic chronic gout of multiple sites with tophus 03/20/2020    Past Medical History:  Diagnosis Date   Gout    Inflammatory polyarthritis (HCC)     Family History  Problem Relation Age of Onset   Hypertension Sister    Depression Neg Hx    Diabetes Neg Hx    History reviewed. No pertinent surgical history. Social History   Social History Narrative   Not on file   Immunization History  Administered Date(s) Administered   PFIZER(Purple Top)SARS-COV-2 Vaccination 04/14/2020, 05/14/2020, 01/05/2021     Objective: Vital Signs: BP 114/71 (BP Location: Right Arm, Patient Position: Sitting, Cuff Size: Normal)   Pulse 98   Ht 5\' 6"  (1.676 m)   Wt 188 lb 9.6 oz (85.5 kg)   BMI 30.44 kg/m    Physical Exam Cardiovascular:     Rate and Rhythm: Normal rate and regular rhythm.  Pulmonary:     Effort: Pulmonary effort is normal.     Breath sounds: Normal breath sounds.  Musculoskeletal:     Right lower leg: No edema.     Left lower leg: No edema.  Skin:    General: Skin is warm and dry.     Findings: No rash.  Neurological:     Mental Status: He is alert.  Psychiatric:        Mood and Affect: Mood normal.      Musculoskeletal Exam:  Shoulders full ROM no tenderness or swelling Elbows full ROM no tenderness or swelling, tophus present on right oleranon bursa and let olecranon bursa also distal to elbow Wrists full ROM no tenderness or swelling Fingers with several non reducible swan neck deformities with lack of PIP flexion ROM Knees full ROM no tenderness or swelling, right knee clicking at anterior lateral joint line Ankles full ROM no tenderness or swelling   Investigation: No additional findings.  Imaging: No results found.  Recent Labs: Lab Results  Component Value Date   WBC 7.6  04/05/2022   HGB 17.9 (H) 04/05/2022   PLT 235 04/05/2022   NA 141 08/02/2022   K 4.5 08/02/2022   CL 104 08/02/2022   CO2 20 08/02/2022   GLUCOSE 95 08/02/2022   BUN 29 (H) 08/02/2022   CREATININE 1.72 (H) 08/02/2022   BILITOT 0.4 08/02/2022   ALKPHOS 102 08/02/2022   AST 38 08/02/2022   ALT 25 08/02/2022   PROT 7.6 08/02/2022   ALBUMIN 5.0 08/02/2022   CALCIUM 10.0 08/02/2022   GFRAA 54 (L) 02/16/2021    Speciality Comments: No specialty comments available.  Procedures:  No procedures performed Allergies: Patient has no known allergies.   Assessment / Plan:     Visit Diagnoses: Idiopathic chronic gout of multiple sites with tophus  Chronic gout with tophus, unspecified cause, unspecified site  Stage  3a chronic kidney disease (HCC)  ***  Orders: No orders of the defined types were placed in this encounter.  No orders of the defined types were placed in this encounter.    Follow-Up Instructions: Return in about 1 year (around 09/07/2023) for Gout on allopurinol f/u 1 yr.   Collier Salina, MD  Note - This record has been created using [employer].  Chart creation errors have been sought, but may not always  have been located. Such creation errors do not reflect on  the standard of medical care.

## 2022-09-06 ENCOUNTER — Encounter: Payer: Self-pay | Admitting: Internal Medicine

## 2022-09-06 ENCOUNTER — Ambulatory Visit: Payer: Self-pay | Attending: Internal Medicine | Admitting: Internal Medicine

## 2022-09-06 ENCOUNTER — Other Ambulatory Visit (HOSPITAL_COMMUNITY)
Admission: RE | Admit: 2022-09-06 | Discharge: 2022-09-06 | Disposition: A | Discharge status: Home / Self Care | Payer: Self-pay | Source: Ambulatory Visit | Attending: Internal Medicine | Admitting: Internal Medicine

## 2022-09-06 VITALS — BP 114/71 | HR 98 | Ht 66.0 in | Wt 188.6 lb

## 2022-09-06 DIAGNOSIS — M1A09X1 Idiopathic chronic gout, multiple sites, with tophus (tophi): Secondary | ICD-10-CM | POA: Insufficient documentation

## 2022-09-06 DIAGNOSIS — M1A9XX1 Chronic gout, unspecified, with tophus (tophi): Secondary | ICD-10-CM

## 2022-09-06 DIAGNOSIS — N1831 Chronic kidney disease, stage 3a: Secondary | ICD-10-CM

## 2022-09-06 LAB — BASIC METABOLIC PANEL
Anion gap: 9 (ref 5–15)
BUN: 26 mg/dL — ABNORMAL HIGH (ref 6–20)
CO2: 25 mmol/L (ref 22–32)
Calcium: 10.1 mg/dL (ref 8.9–10.3)
Chloride: 104 mmol/L (ref 98–111)
Creatinine, Ser: 1.87 mg/dL — ABNORMAL HIGH (ref 0.61–1.24)
GFR, Estimated: 43 mL/min — ABNORMAL LOW (ref >60)
Glucose, Bld: 100 mg/dL — ABNORMAL HIGH (ref 70–99)
Potassium: 4.7 mmol/L (ref 3.5–5.1)
Sodium: 138 mmol/L (ref 135–145)

## 2022-09-06 LAB — URIC ACID: Uric Acid, Serum: 4.8 mg/dL (ref 3.7–8.6)

## 2022-09-19 ENCOUNTER — Other Ambulatory Visit: Payer: Self-pay

## 2022-10-16 ENCOUNTER — Other Ambulatory Visit: Payer: Self-pay

## 2022-10-24 ENCOUNTER — Ambulatory Visit: Payer: No Typology Code available for payment source

## 2022-10-31 ENCOUNTER — Ambulatory Visit: Payer: Self-pay | Attending: Family Medicine

## 2022-11-15 ENCOUNTER — Other Ambulatory Visit: Payer: Self-pay

## 2022-11-15 IMAGING — US US THYROID
1 series · 14 of 25 positions shown · non-contrast
Comparison: None.

CLINICAL DATA: Subclinical hyperthyroidism

EXAM:
THYROID ULTRASOUND
TECHNIQUE: Ultrasound examination of the thyroid gland and adjacent soft
tissues was performed.

[Series 1: us thyroid · 87 acquisitions, 14 frames shown]
[im 1/87]
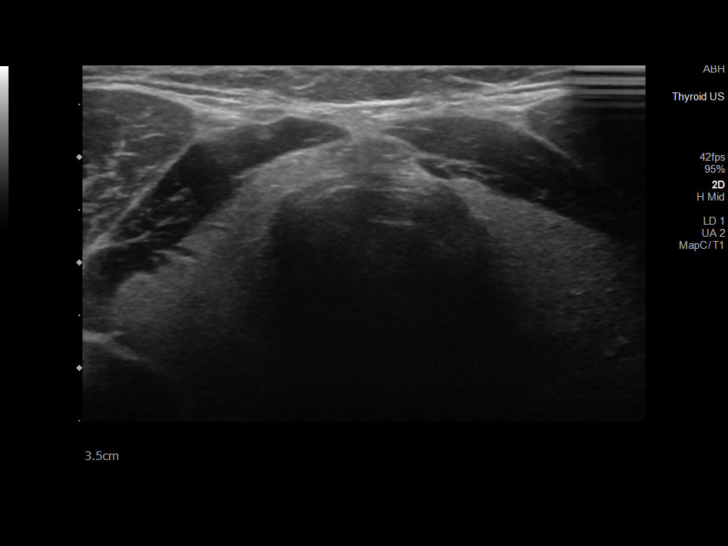
[im 8/87]
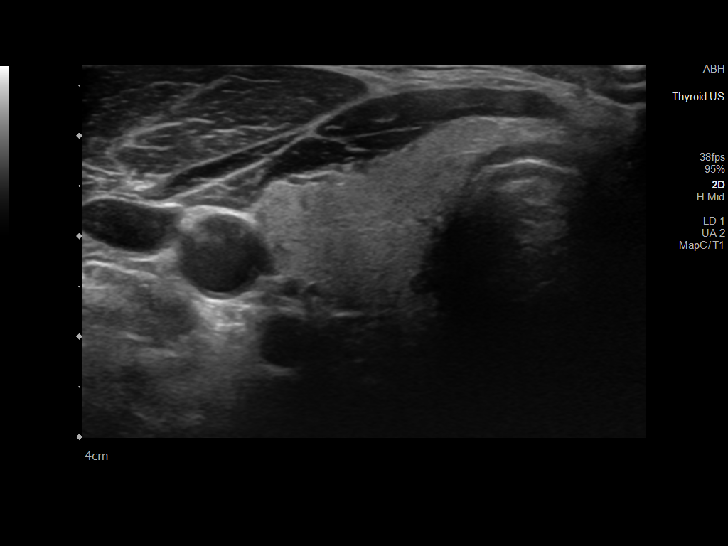
[im 15/87]
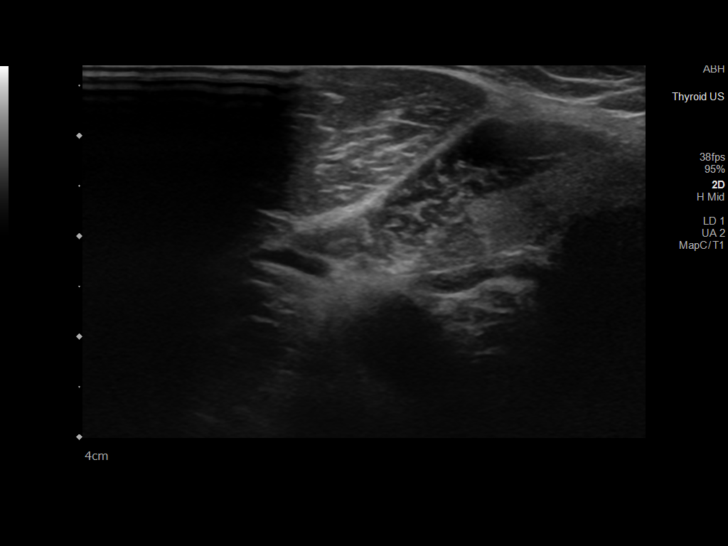
[im 22/87]
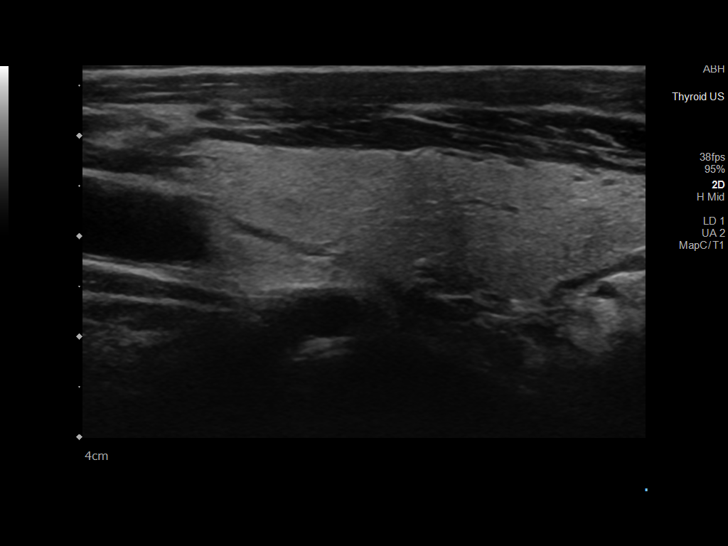
[im 29/87]
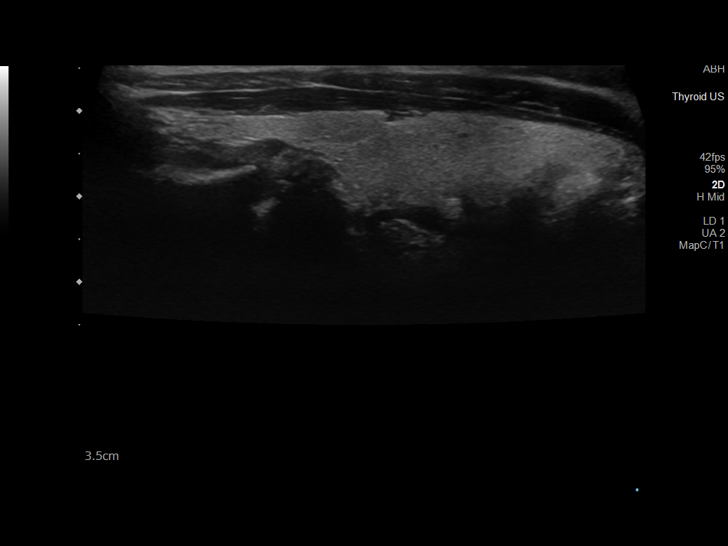
[im 33/87]
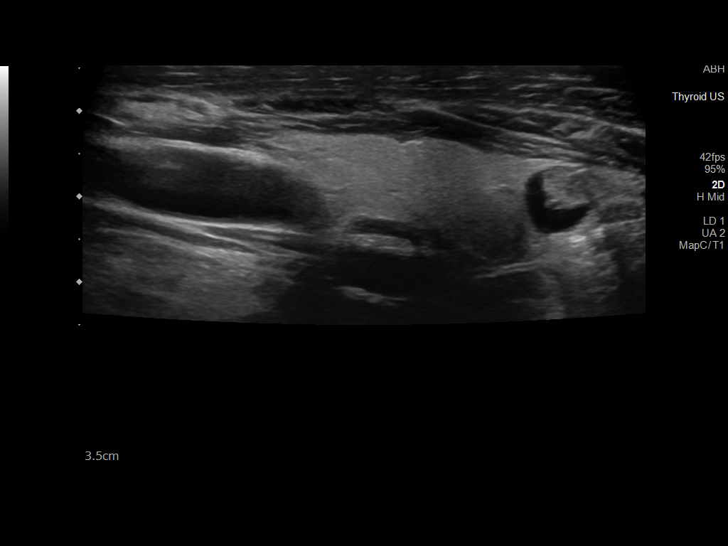
[im 40/87]
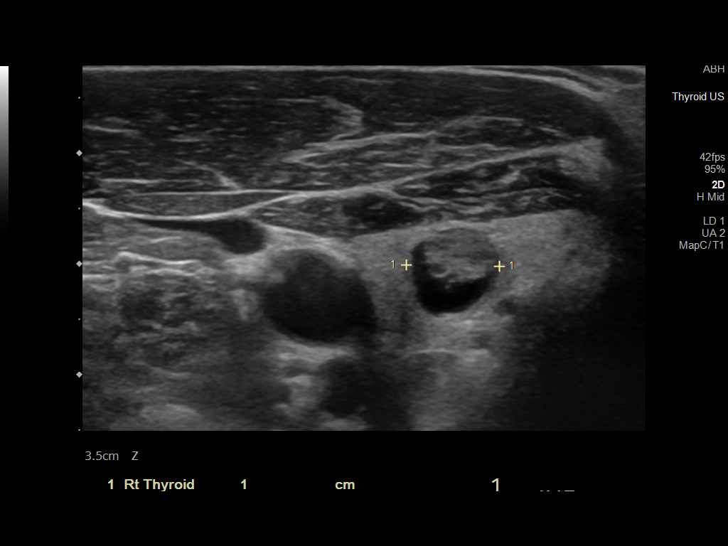
[im 47/87]
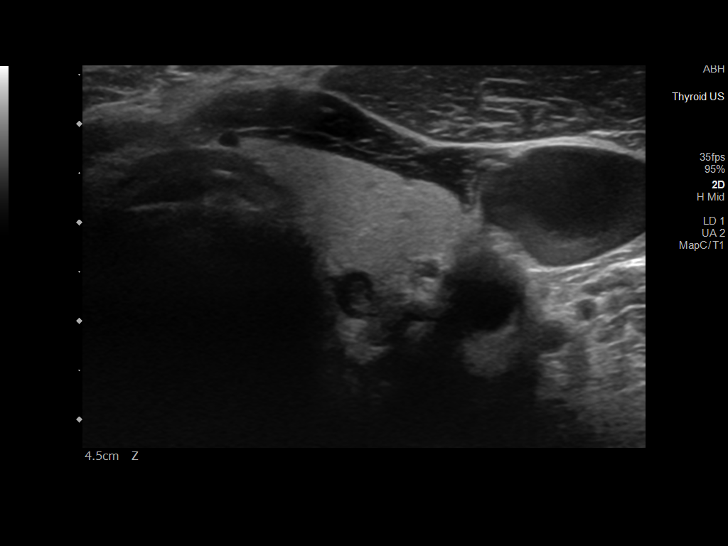
[im 54/87]
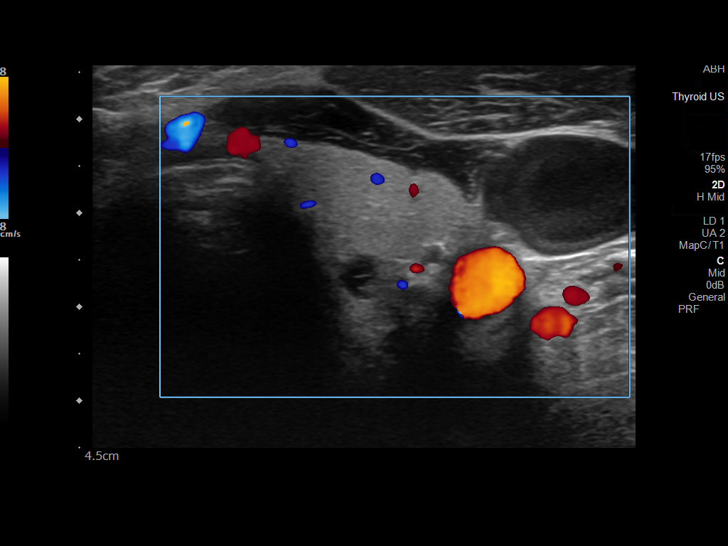
[im 58/87]
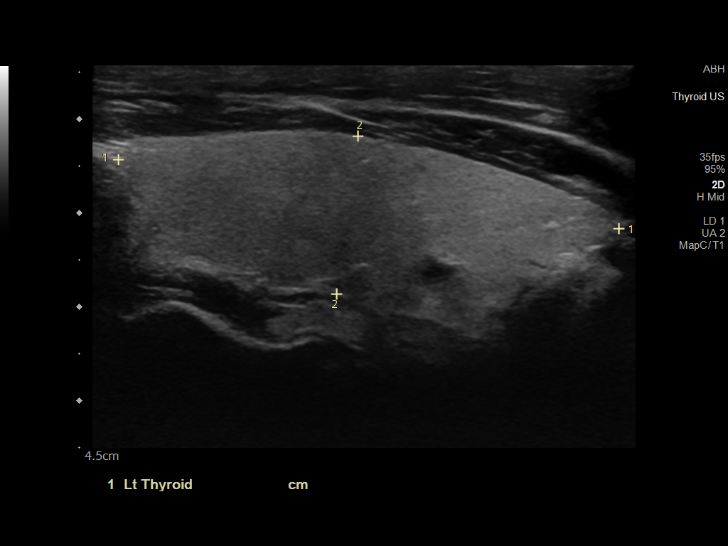
[im 65/87]
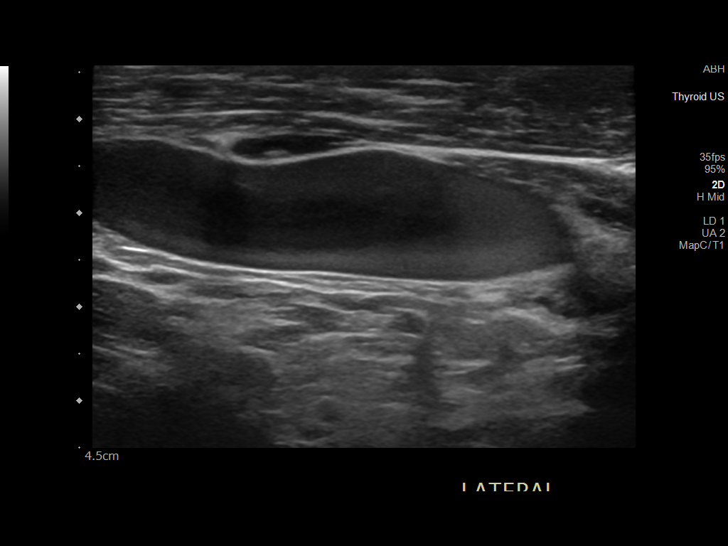
[im 72/87]
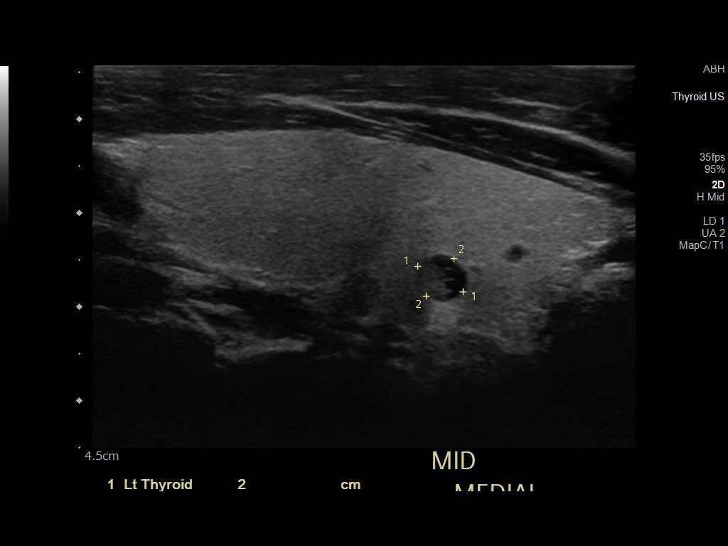
[im 79/87]
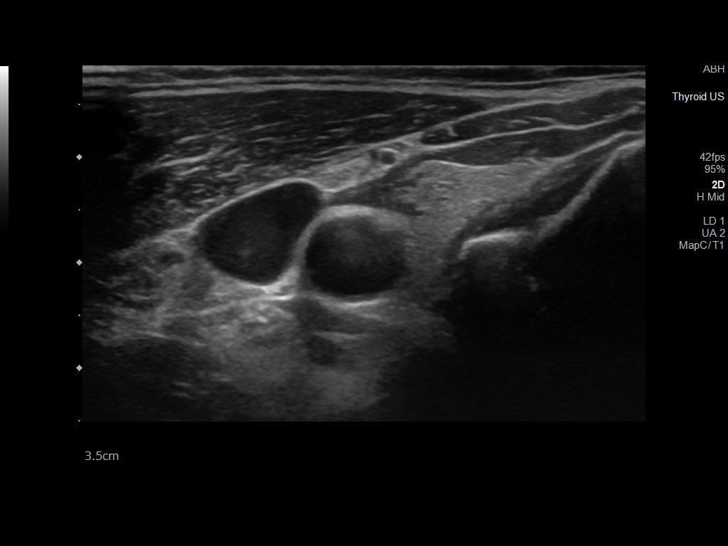
[im 87/87]
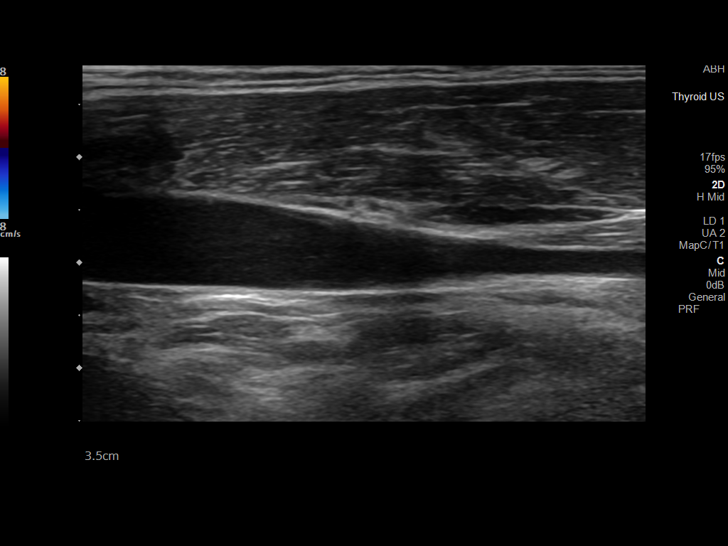

[14 of 25 positions shown; findings below may reference images not displayed]

FINDINGS: Parenchymal Echotexture: Normal

Isthmus: 0.2 cm

Right lobe: 5.5 x 1.5 x 1.9 cm

Left lobe: 5.4 x 1.7 x 1.7 cm

_________________________________________________________

Estimated total number of nodules >/= 1 cm: 0

Number of spongiform nodules >/=  2 cm not described below (TR1): 0

Number of mixed cystic and solid nodules >/= 1.5 cm not described
below (TR2): 0

_________________________________________________________

Subcentimeter mixed solid cystic nodule in the inferior right
thyroid lobe does not meet criteria for FNA or surveillance.

Subcentimeter mixed solid cystic nodule in the inferior left thyroid
lobe does not meet criteria for FNA or surveillance.

Incidental note made of slow flow in the left internal jugular vein.
IMPRESSION: No suspicious thyroid nodules.

The above is in keeping with the ACR TI-RADS recommendations - [HOSPITAL] 6904;[DATE].

## 2022-12-13 ENCOUNTER — Other Ambulatory Visit: Payer: Self-pay

## 2022-12-20 ENCOUNTER — Other Ambulatory Visit: Payer: Self-pay

## 2023-01-08 ENCOUNTER — Other Ambulatory Visit: Payer: Self-pay | Admitting: Nurse Practitioner

## 2023-01-08 ENCOUNTER — Other Ambulatory Visit: Payer: Self-pay

## 2023-01-08 DIAGNOSIS — I10 Essential (primary) hypertension: Secondary | ICD-10-CM

## 2023-01-08 MED ORDER — AMLODIPINE BESYLATE 10 MG PO TABS
10.0000 mg | ORAL_TABLET | Freq: Every day | ORAL | 2 refills | Status: DC
  Filled 2023-01-08: qty 90, 90d supply, fill #0
  Filled 2023-04-28: qty 90, 90d supply, fill #1
  Filled 2023-09-02: qty 90, 90d supply, fill #2

## 2023-01-30 ENCOUNTER — Other Ambulatory Visit: Payer: Self-pay | Admitting: Nurse Practitioner

## 2023-01-30 ENCOUNTER — Other Ambulatory Visit: Payer: Self-pay

## 2023-01-30 DIAGNOSIS — I10 Essential (primary) hypertension: Secondary | ICD-10-CM

## 2023-01-30 DIAGNOSIS — N1832 Chronic kidney disease, stage 3b: Secondary | ICD-10-CM

## 2023-01-30 NOTE — Telephone Encounter (Signed)
Requested medication (s) are due for refill today: Yes  Requested medication (s) are on the active medication list: yes    Last refill: 07/05/22  #90  1 refill  Future visit scheduled no  Notes to clinic: Pt needs appt, attempted to call to secure, VM not set up. Please review. Thank you.  Requested Prescriptions  Pending Prescriptions Disp Refills   losartan (COZAAR) 100 MG tablet 90 tablet 1    Sig: Take 1 tablet (100 mg total) by mouth daily.     Cardiovascular:  Angiotensin Receptor Blockers Failed - 01/30/2023  8:54 AM      Failed - Cr in normal range and within 180 days    Creatinine, Ser  Date Value Ref Range Status  09/06/2022 1.87 (H) 0.61 - 1.24 mg/dL Final         Failed - Valid encounter within last 6 months    Recent Outpatient Visits           6 months ago Essential hypertension   [place], Vernia Buff, NP   9 months ago Essential hypertension   [place], Vernia Buff, NP   10 months ago Essential hypertension   [place], Vernia Buff, NP   1 year ago Essential hypertension   [place], [place] W, NP   2 years ago Dyslipidemia, goal LDL below [place], Vernia Buff, NP       Future Appointments             In 7 months Rice, Resa Miner, MD [place] - K in normal range and within 180 days    Potassium  Date Value Ref Range Status  09/06/2022 4.7 3.5 - 5.1 mmol/L Final         Passed - Patient is not pregnant      Passed - Last BP in normal range    BP Readings from Last 1 Encounters:  09/06/22 114/71

## 2023-01-31 ENCOUNTER — Ambulatory Visit: Payer: Self-pay | Admitting: *Deleted

## 2023-01-31 ENCOUNTER — Ambulatory Visit
Admission: EM | Admit: 2023-01-31 | Discharge: 2023-01-31 | Disposition: A | Discharge status: Home / Self Care | Payer: No Typology Code available for payment source

## 2023-01-31 ENCOUNTER — Other Ambulatory Visit: Payer: Self-pay

## 2023-01-31 DIAGNOSIS — R1032 Left lower quadrant pain: Secondary | ICD-10-CM

## 2023-01-31 MED ORDER — LOSARTAN POTASSIUM 100 MG PO TABS
100.0000 mg | ORAL_TABLET | Freq: Every day | ORAL | 1 refills | Status: DC
  Filled 2023-01-31: qty 90, 90d supply, fill #0
  Filled 2023-04-28: qty 90, 90d supply, fill #1

## 2023-01-31 NOTE — ED Notes (Signed)
Patient is being discharged from the Urgent Care and sent to the Emergency Department via POV . Per Gutierrez, NP, patient is in need of higher level of care due to abdominal pain. Patient is aware and verbalizes understanding of plan of care.  Vitals:   01/31/23 1141  BP: 121/80  Pulse: 96  Resp: 16  Temp: 98.3 F (36.8 C)  SpO2: 96%

## 2023-01-31 NOTE — Discharge Instructions (Signed)
Go straight to the emergency department as soon as you leave urgent care for further evaluation and management.

## 2023-01-31 NOTE — Telephone Encounter (Signed)
Patient arrived at [facility] at 10:12 today.

## 2023-01-31 NOTE — Telephone Encounter (Signed)
  Chief Complaint: constant abdominal pain  Symptoms: left side abdominal pain  started last night sudden, constant pain. Tender to touch. Feels like gas. Loose BM this am.  Frequency: last night  Pertinent Negatives: Patient denies fever no severe pain, no N/V Disposition: [] ED /[x] Urgent Care (no appt availability in office) / [] Appointment(In office/virtual)/ []  [place] [facility]/ [] Home Care/ [] Refused Recommended Disposition /[] [place] [facility]/ []  Follow-up with PCP Additional Notes:   No available appt until March. Recommended UC.    Reason for Disposition  [1] MILD-MODERATE pain AND [2] constant AND [3] present > 2 hours  Answer Assessment - Initial Assessment Questions 1. LOCATION: "Where does it hurt?"      Left low abdominal pain  2. RADIATION: "Does the pain shoot anywhere else?" (e.g., chest, back)     No  3. ONSET: "When did the pain begin?" (Minutes, hours or days ago)      Last night  4. SUDDEN: "Gradual or sudden onset?"     Sudden  5. PATTERN "Does the pain come and go, or is it constant?"    - If it comes and goes: "How long does it last?" "Do you have pain now?"     (Note: Comes and goes means the pain is intermittent. It goes away completely between bouts.)    - If constant: "Is it getting better, staying the same, or getting worse?"      (Note: Constant means the pain never goes away completely; most serious pain is constant and gets worse.)      Constant  6. SEVERITY: "How bad is the pain?"  (e.g., Scale 1-10; mild, moderate, or severe)    - MILD (1-3): Doesn't interfere with normal activities, abdomen soft and not tender to touch.     - MODERATE (4-7): Interferes with normal activities or awakens from sleep, abdomen tender to touch.     - SEVERE (8-10): Excruciating pain, doubled over, unable to do any normal activities.       Moderate tender to touch feels like gas pain or bladder  7. RECURRENT SYMPTOM: "Have you ever had this type of stomach pain  before?" If Yes, ask: "When was the last time?" and "What happened that time?"      Loose BM this am  8. CAUSE: "What do you think is causing the stomach pain?"     Not sure  9. RELIEVING/AGGRAVATING FACTORS: "What makes it better or worse?" (e.g., antacids, bending or twisting motion, bowel movement)     Na  10. OTHER SYMPTOMS: "Do you have any other symptoms?" (e.g., back pain, diarrhea, fever, urination pain, vomiting)       Loose stool ,left side pain  Protocols used: Abdominal Pain - Male-A-AH

## 2023-01-31 NOTE — ED Provider Notes (Signed)
[facility]    CSN: [id number] Arrival date & time: 01/31/23  1012      History   Chief Complaint Chief Complaint  Patient presents with   Abdominal Pain    HPI HALLET OBENOUR is a 52 y.o. male.   Patient presents for left lower quadrant abdominal pain that started last night around 11 PM.  Patient reports it is intermittent but is rated 8/10 on pain scale.  He describes it as a cramping pain.  Reports some intermittent diarrhea for the past few days as well.  Denies nausea, vomiting, fever.  Denies any recent blood in stool.  Denies history of chronic stomach problems.   Abdominal Pain   Past Medical History:  Diagnosis Date   Gout    Inflammatory polyarthritis [facility])     Patient Active Problem List   Diagnosis Date Noted   CKD (chronic kidney disease) stage 3, GFR 30-59 ml/min ([place]) 03/20/2020   Essential hypertension 03/20/2020   Idiopathic chronic gout of multiple sites with tophus 03/20/2020    History reviewed. No pertinent surgical history.     Home Medications    Prior to Admission medications   Medication Sig Start Date End Date Taking? Authorizing Provider  allopurinol (ZYLOPRIM) 300 MG tablet TAKE 2 TABLETS (600 MG TOTAL) BY MOUTH DAILY. 07/05/22   Gildardo Pounds, NP  amLODipine (NORVASC) 10 MG tablet Take 1 tablet (10 mg total) by mouth daily. 01/08/23   Gildardo Pounds, NP  colchicine 0.6 MG tablet Take 1 tablet (0.6 mg total) by mouth daily as needed (for gout flare). 07/05/22   Gildardo Pounds, NP  losartan (COZAAR) 100 MG tablet Take 1 tablet (100 mg total) by mouth daily. 01/31/23   Gildardo Pounds, NP    Family History Family History  Problem Relation Age of Onset   Hypertension Sister    Depression Neg Hx    Diabetes Neg Hx     Social History Social History   Tobacco Use   Smoking status: Never   Smokeless tobacco: Never  Vaping Use   Vaping Use: Never used  Substance Use Topics   Alcohol use: Not Currently   Drug  use: Never     Allergies   Patient has no known allergies.   Review of Systems Review of Systems Per HPI  Physical Exam Triage Vital Signs ED Triage Vitals  Enc Vitals Group     BP 01/31/23 1141 121/80     Pulse Rate 01/31/23 1141 96     Resp 01/31/23 1141 16     Temp 01/31/23 1141 98.3 F (36.8 C)     Temp Source 01/31/23 1141 Oral     SpO2 01/31/23 1141 96 %     Weight --      Height --      Head Circumference --      Peak Flow --      Pain Score 01/31/23 1138 8     Pain Loc --      Pain Edu? --      Excl. in [employer]? --    No data found.  Updated Vital Signs BP 121/80 (BP Location: Left Arm)   Pulse 96   Temp 98.3 F (36.8 C) (Oral)   Resp 16   SpO2 96%   Visual Acuity Right Eye Distance:   Left Eye Distance:   Bilateral Distance:    Right Eye Near:   Left Eye Near:    Bilateral Near:  Physical Exam Constitutional:      General: He is not in acute distress.    Appearance: Normal appearance. He is not toxic-appearing or diaphoretic.  HENT:     Head: Normocephalic and atraumatic.  Eyes:     Extraocular Movements: Extraocular movements intact.     Conjunctiva/sclera: Conjunctivae normal.  Cardiovascular:     Rate and Rhythm: Normal rate and regular rhythm.     Pulses: Normal pulses.     Heart sounds: Normal heart sounds.  Pulmonary:     Effort: Pulmonary effort is normal. No respiratory distress.     Breath sounds: Normal breath sounds.  Abdominal:     General: Bowel sounds are normal. There is no distension.     Palpations: Abdomen is soft.     Tenderness: There is abdominal tenderness in the left lower quadrant. There is guarding.     Comments: Patient is significantly tender to palpation to left lower quadrant.  When palpating right lower quadrant, patient reports that he can feel it in the left lower quadrant.  Neurological:     General: No focal deficit present.     Mental Status: He is alert and oriented to person, place, and time. Mental  status is at baseline.  Psychiatric:        Mood and Affect: Mood normal.        Behavior: Behavior normal.        Thought Content: Thought content normal.        Judgment: Judgment normal.      UC Treatments / Results  Labs (all labs ordered are listed, but only abnormal results are displayed) Labs Reviewed - No data to display  EKG   Radiology No results found.  Procedures Procedures (including critical care time)  Medications Ordered in UC Medications - No data to display  Initial Impression / Assessment and Plan / UC Course  I have reviewed the triage vital signs and the nursing notes.  Pertinent labs & imaging results that were available during my care of the patient were reviewed by me and considered in my medical decision making (see chart for details).     Patient is significantly tender to palpation to lower abdomen, and I do think this warrants imaging of the abdomen.  Cannot provide CT imaging here in urgent care so patient was advised to go to the ER for further evaluation and management.  He was agreeable with plan.  Vital signs stable at discharge.  Agree with patient self transport to the hospital. Final Clinical Impressions(s) / UC Diagnoses   Final diagnoses:  Abdominal pain, left lower quadrant     Discharge Instructions      Go straight to the emergency department as soon as you leave urgent care for further evaluation and management.    ED Prescriptions   None    PDMP not reviewed this encounter.   Teodora Medici, [place] 01/31/23 [phone]

## 2023-01-31 NOTE — ED Triage Notes (Addendum)
Patient presents to UC for LLQ pain since last night. Not taking any medications.   Denies n/v or urinary symptoms.

## 2023-03-06 ENCOUNTER — Other Ambulatory Visit: Payer: Self-pay | Admitting: Nurse Practitioner

## 2023-03-06 DIAGNOSIS — M1A9XX1 Chronic gout, unspecified, with tophus (tophi): Secondary | ICD-10-CM

## 2023-03-10 ENCOUNTER — Other Ambulatory Visit: Payer: Self-pay

## 2023-03-11 ENCOUNTER — Other Ambulatory Visit: Payer: Self-pay | Admitting: Nurse Practitioner

## 2023-03-11 ENCOUNTER — Other Ambulatory Visit: Payer: Self-pay | Admitting: *Deleted

## 2023-03-11 ENCOUNTER — Other Ambulatory Visit: Payer: Self-pay

## 2023-03-11 DIAGNOSIS — M1A9XX1 Chronic gout, unspecified, with tophus (tophi): Secondary | ICD-10-CM

## 2023-03-11 MED ORDER — ALLOPURINOL 300 MG PO TABS
600.0000 mg | ORAL_TABLET | Freq: Every day | ORAL | 1 refills | Status: DC
  Filled 2023-03-11: qty 180, 90d supply, fill #0
  Filled 2023-06-20 (×2): qty 180, 90d supply, fill #1

## 2023-03-11 NOTE — Telephone Encounter (Signed)
I am fine with taking over this prescription, current dose allopurinol 600 mg daily. He has been getting this 60 tablets at a time, if he prefers we can switch to 90 day supply

## 2023-03-11 NOTE — Telephone Encounter (Signed)
Patient contacted the office regarding a refill on Allopurinol. According to patient's chart we have not filled this prescription for patient previously. Are we taking over this prescription for patient? If so patient will need refill sent to the [facility].

## 2023-03-11 NOTE — Telephone Encounter (Signed)
Patient advised Dr. Benjamine Mola is fine with taking over this prescription, current dose allopurinol 600 mg daily. He has been getting this 60 tablets at a time, if he prefers we can switch to 90 day supply. Patient states he would like a 90 day supply.

## 2023-03-11 NOTE — Addendum Note (Signed)
Addended by: Carole Binning on: 03/11/2023 01:46 PM   Modules accepted: Orders

## 2023-04-22 ENCOUNTER — Ambulatory Visit: Payer: Self-pay | Attending: Nurse Practitioner | Admitting: Nurse Practitioner

## 2023-04-22 DIAGNOSIS — M1A9XX1 Chronic gout, unspecified, with tophus (tophi): Secondary | ICD-10-CM

## 2023-04-22 NOTE — Progress Notes (Signed)
DID NOT SIGN IN FOR VISIT

## 2023-04-28 ENCOUNTER — Other Ambulatory Visit: Payer: Self-pay

## 2023-04-29 ENCOUNTER — Other Ambulatory Visit: Payer: Self-pay

## 2023-06-20 ENCOUNTER — Other Ambulatory Visit: Payer: Self-pay

## 2023-09-01 NOTE — Progress Notes (Signed)
Office Visit Note  Patient: Nathan Rubio             Date of Birth: 1971/09/02           MRN: [id number]             PCP: Claiborne Rigg, NP Referring: Claiborne Rigg, NP Visit Date: 09/12/2023   Subjective:  Follow-up   History of Present Illness: Nathan Rubio is a 52 y.o. male here for follow up for chronic, polyarticular, tophaceous, erosive gout  on allopurinol 600 mg daily.  Symptoms are overall doing well without daily stiffness or joint swelling. He as days intermittently with swelling, recalls 2 recent times following drinking iced coffee and after eating a large amount of watermelon.  Previous HPI 09/06/2022 Nathan Rubio is a 52 y.o. male here for follow up for chronic, polyarticular, tophaceous, erosive gout  on allopurinol 600 mg daily. He had no major flare since our last visit. He took aleve for minor pain in the right knee recently but just once dose. He had a decrease in renal function on most recent labs concerning for CKD progression or medication side effect. This was a month after increase in losartan dose and addition of amlodipine for incompletely controlled hypertension.   Previous HPI 03/06/2022 Nathan Rubio is a 52 y.o. male here for follow up for chronic, polyarticular, tophaceous, erosive gout on allopurinol 600 mg daily. He is taking allopurinol 600 mg daily reports pretty good adherence. He only took the colchicine 1 or 2 occasions usually has not had much pain. He has been drinking water some of the time but also still drinks a lot of soda. He had a recent vacation in [place] back since this weekend so thinks his diet was more liberal than usual recently.   Previous HPI 09/05/21 Nathan Rubio is a 52 y.o. male here for follow up for chronic, polyarticular, tophaceous, erosive gouty arthritis on allopurinol 600 mg daily and colchicine 0.6 mg daily as needed.  Overall since last visit doing reasonably well.  He feels there have been a few minor gout  flareups but no very severe episodes.  Most recently involved was left knee pain and stiffness that has improved.  He denies any trouble taking the medication.   Previous HPI 02/16/21 Nathan Rubio is a 52 y.o. male with HTN, HLD, CKD stage 3 here for evaluation and management of chronic tophaceous gout.  He has gout flares since at least 25 years ago with varying frequency but somewhere between 3-6 times per year most of the time.  More recently he is also had persistent pain and stiffness even between the specific flares.  He was treated with allopurinol in the past but did not stay on this medicine consistently due to lack of medical follow-up. He used to take NSAIDs OTC heavily for symptoms but stopped after being found to have CKD. 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 He was previously seen with Dr. Nydia Bouton at [facility] rheumatology but not following up due to costs. 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 He has been back on allopurinol not taking 600 mg daily for few months most recent uric acid was at goal in November.  He is taking colchicine as needed for flares which is usually effective within 2 to 3 days.   Review of Systems  Constitutional:  Negative  for fatigue.  HENT:  Negative for mouth sores and mouth dryness.   Eyes:  Negative for dryness.  Respiratory:  Negative for shortness of breath.   Cardiovascular:  Negative for chest pain and palpitations.  Gastrointestinal:  Negative for blood in stool, constipation and diarrhea.  Endocrine: Negative for increased urination.  Genitourinary:  Negative for involuntary urination.  Musculoskeletal:  Negative for joint pain, gait problem, joint pain, joint swelling, myalgias, muscle weakness, morning stiffness, muscle tenderness and myalgias.  Skin:  Negative for color change, rash, hair loss and  sensitivity to sunlight.  Allergic/Immunologic: Negative for susceptible to infections.  Neurological:  Negative for dizziness and headaches.  Hematological:  Negative for swollen glands.  Psychiatric/Behavioral:  Negative for depressed mood and sleep disturbance. The patient is not nervous/anxious.     PMFS History:  Patient Active Problem List   Diagnosis Date Noted   CKD (chronic kidney disease) stage 3, GFR 30-59 ml/min (HCC) 03/20/2020   Essential hypertension 03/20/2020   Idiopathic chronic gout of multiple sites with tophus 03/20/2020    Past Medical History:  Diagnosis Date   Gout    Inflammatory polyarthritis (HCC)     Family History  Problem Relation Age of Onset   Hypertension Sister    Depression Neg Hx    Diabetes Neg Hx    History reviewed. No pertinent surgical history. Social History   Social History Narrative   Not on file   Immunization History  Administered Date(s) Administered   PFIZER(Purple Top)SARS-COV-2 Vaccination 04/14/2020, 05/14/2020, 01/05/2021     Objective: Vital Signs: BP 118/84 (BP Location: Left Arm, Patient Position: Sitting, Cuff Size: Normal)   Pulse 80   Resp 14   Ht 5\' 6"  (1.676 m)   Wt 191 lb (86.6 kg)   BMI 30.83 kg/m    Physical Exam Eyes:     Conjunctiva/sclera: Conjunctivae normal.  Cardiovascular:     Rate and Rhythm: Normal rate and regular rhythm.  Pulmonary:     Effort: Pulmonary effort is normal.     Breath sounds: Normal breath sounds.  Musculoskeletal:     Right lower leg: No edema.     Left lower leg: No edema.  Skin:    General: Skin is warm and dry.     Findings: No rash.  Neurological:     Mental Status: He is alert.  Psychiatric:        Mood and Affect: Mood normal.      Musculoskeletal Exam:  Shoulders full ROM no tenderness or swelling Elbows full ROM no tenderness or swelling firm mobile nontender nodules in both olecranon bursa Wrists restricted range of motion with hard endpoints, no  tenderness or palpable swelling Chronic MCP joint widening slight subluxation decreased extension range of motion, PIP joint flexion limited with swan-neck deformities, DIP bony nodules worse at fifth digit Knees full ROM no tenderness or swelling Ankles full ROM no tenderness or swelling   Investigation: No additional findings.  Imaging: No results found.  Recent Labs: Lab Results  Component Value Date   WBC 7.6 04/05/2022   HGB 17.9 (H) 04/05/2022   PLT 235 04/05/2022   NA 138 09/06/2022   K 4.7 09/06/2022   CL 104 09/06/2022   CO2 25 09/06/2022   GLUCOSE 100 (H) 09/06/2022   BUN 26 (H) 09/06/2022   CREATININE 1.87 (H) 09/06/2022   BILITOT 0.4 08/02/2022   ALKPHOS 102 08/02/2022   AST 38 08/02/2022   ALT 25 08/02/2022   PROT 7.6  08/02/2022   ALBUMIN 5.0 08/02/2022   CALCIUM 10.1 09/06/2022   GFRAA 54 (L) 02/16/2021    Speciality Comments: No specialty comments available.  Procedures:  No procedures performed Allergies: Patient has no known allergies.   Assessment / Plan:     Visit Diagnoses: Idiopathic chronic gout of multiple sites with tophus - 09/06/2022 Uric Acid 4.8  Chronic tophaceous erosive gout contributed to by CKD. Currently appears well-controlled.  Mild gradual reduction in tophi size.  Will recheck serum uric acid level.  Using as needed naproxen for flares but total amount is very low.  Plan to continue allopurinol 600 mg daily.  Medication monitoring encounter - allopurinol 600 mg daily Stage 3a chronic kidney disease (HCC)  Rechecking blood count and basic metabolic panel for monitoring with long-term medication use in setting of chronic kidney disease stage III.  He does not appear to have any recent PCP office labs monitoring on losartan for hypertension.  Printed orders provided for collection at [facility] for CFA.  Orders: No orders of the defined types were placed in this encounter.  No orders of the defined types were placed in this  encounter.    Follow-Up Instructions: Return in about 1 year (around 09/11/2024) for Gout on allopurinol f/u 1yr.   Fuller Plan, MD  Note - This record has been created using [employer].  Chart creation errors have been sought, but may not always  have been located. Such creation errors do not reflect on  the standard of medical care.

## 2023-09-02 ENCOUNTER — Other Ambulatory Visit: Payer: Self-pay | Admitting: Nurse Practitioner

## 2023-09-02 ENCOUNTER — Other Ambulatory Visit: Payer: Self-pay

## 2023-09-02 DIAGNOSIS — I10 Essential (primary) hypertension: Secondary | ICD-10-CM

## 2023-09-02 DIAGNOSIS — N1832 Chronic kidney disease, stage 3b: Secondary | ICD-10-CM

## 2023-09-02 MED ORDER — LOSARTAN POTASSIUM 100 MG PO TABS
100.0000 mg | ORAL_TABLET | Freq: Every day | ORAL | 1 refills | Status: DC
  Filled 2023-09-02: qty 90, 90d supply, fill #0
  Filled 2024-01-09 (×2): qty 90, 90d supply, fill #1

## 2023-09-04 ENCOUNTER — Other Ambulatory Visit: Payer: Self-pay

## 2023-09-09 ENCOUNTER — Other Ambulatory Visit: Payer: Self-pay

## 2023-09-12 ENCOUNTER — Ambulatory Visit: Payer: Self-pay | Attending: Internal Medicine | Admitting: Internal Medicine

## 2023-09-12 ENCOUNTER — Encounter: Payer: Self-pay | Admitting: Internal Medicine

## 2023-09-12 VITALS — BP 118/84 | HR 80 | Resp 14 | Ht 66.0 in | Wt 191.0 lb

## 2023-09-12 DIAGNOSIS — N1831 Chronic kidney disease, stage 3a: Secondary | ICD-10-CM

## 2023-09-12 DIAGNOSIS — M1A09X1 Idiopathic chronic gout, multiple sites, with tophus (tophi): Secondary | ICD-10-CM

## 2023-09-12 DIAGNOSIS — Z5181 Encounter for therapeutic drug level monitoring: Secondary | ICD-10-CM

## 2023-09-19 ENCOUNTER — Telehealth: Payer: Self-pay | Admitting: Nurse Practitioner

## 2023-09-19 NOTE — Telephone Encounter (Signed)
Pt is calling to schedule appointment for orange card. Please advise CB- [phone]

## 2023-09-19 NOTE — Telephone Encounter (Signed)
FYI

## 2023-10-30 ENCOUNTER — Other Ambulatory Visit: Payer: Self-pay | Admitting: Internal Medicine

## 2023-10-30 DIAGNOSIS — M1A9XX1 Chronic gout, unspecified, with tophus (tophi): Secondary | ICD-10-CM

## 2023-10-30 NOTE — Telephone Encounter (Signed)
Last Fill: 03/11/2023  Labs: 09/07/2023 uric acid 4.8, BMP Glucose 100, BUN 26, Creatinine 1.87, eGFR 43, 04/06/2023 CBC Hemoglobin 17.9, Hematocrit 51.4   Next Visit: 09/10/2024   Last Visit: 09/12/2023  DX: Idiopathic chronic gout of multiple sites with tophus -   Current Dose per office note 09/12/2023: allopurinol 600 mg daily   Okay to refill Allopurinol?

## 2023-10-31 ENCOUNTER — Other Ambulatory Visit: Payer: Self-pay

## 2023-10-31 MED ORDER — ALLOPURINOL 300 MG PO TABS
600.0000 mg | ORAL_TABLET | Freq: Every day | ORAL | 3 refills | Status: DC
  Filled 2023-10-31: qty 180, 90d supply, fill #0
  Filled 2024-03-01 (×2): qty 180, 90d supply, fill #1
  Filled 2024-06-10 (×2): qty 180, 90d supply, fill #2

## 2023-11-05 ENCOUNTER — Other Ambulatory Visit: Payer: Self-pay

## 2024-01-09 ENCOUNTER — Other Ambulatory Visit: Payer: Self-pay | Admitting: Nurse Practitioner

## 2024-01-09 ENCOUNTER — Other Ambulatory Visit: Payer: Self-pay

## 2024-01-09 DIAGNOSIS — I10 Essential (primary) hypertension: Secondary | ICD-10-CM

## 2024-01-12 ENCOUNTER — Other Ambulatory Visit: Payer: Self-pay

## 2024-01-14 ENCOUNTER — Other Ambulatory Visit: Payer: Self-pay

## 2024-01-16 ENCOUNTER — Other Ambulatory Visit: Payer: Self-pay | Admitting: Nurse Practitioner

## 2024-01-16 DIAGNOSIS — I10 Essential (primary) hypertension: Secondary | ICD-10-CM

## 2024-01-16 NOTE — Telephone Encounter (Signed)
Pt is requesting a short supply of medication to last him until his appt on 02/08/2023.

## 2024-01-16 NOTE — Telephone Encounter (Signed)
Copied from CRM [id number]. Topic: Clinical - Medication Refill >> Jan 16, 2024 10:26 AM Dondra Prader A wrote: Most Recent Primary Care Visit:  Provider: [name] W  Department: [facility]  Visit Type: MYCHART VIDEO VISIT  Date: 04/22/2023  Medication: amLODipine (NORVASC) 10 MG tablet  Has the patient contacted their pharmacy? Yes  Is this the correct pharmacy for this prescription? Yes [facility] [address] [place] [address] Phone: [phone] Fax: [phone]   Has the prescription been filled recently? No  Is the patient out of the medication? Yes  Has the patient been seen for an appointment in the last year OR does the patient have an upcoming appointment? Yes  Can we respond through MyChart? No  Agent: Please be advised that Rx refills may take up to 3 business days. We ask that you follow-up with your pharmacy.

## 2024-01-20 ENCOUNTER — Other Ambulatory Visit: Payer: Self-pay

## 2024-01-20 ENCOUNTER — Other Ambulatory Visit: Payer: Self-pay | Admitting: Nurse Practitioner

## 2024-01-20 DIAGNOSIS — I10 Essential (primary) hypertension: Secondary | ICD-10-CM

## 2024-01-20 MED ORDER — AMLODIPINE BESYLATE 10 MG PO TABS
10.0000 mg | ORAL_TABLET | Freq: Every day | ORAL | 0 refills | Status: DC
  Filled 2024-01-20: qty 30, 30d supply, fill #0

## 2024-01-21 ENCOUNTER — Other Ambulatory Visit: Payer: Self-pay

## 2024-01-22 ENCOUNTER — Other Ambulatory Visit: Payer: Self-pay

## 2024-02-09 ENCOUNTER — Other Ambulatory Visit: Payer: Self-pay

## 2024-02-09 ENCOUNTER — Encounter: Payer: Self-pay | Admitting: Nurse Practitioner

## 2024-02-09 ENCOUNTER — Ambulatory Visit: Payer: Self-pay | Attending: Nurse Practitioner | Admitting: Nurse Practitioner

## 2024-02-09 VITALS — BP 102/67 | HR 116 | Resp 19 | Ht 66.0 in | Wt 191.4 lb

## 2024-02-09 DIAGNOSIS — I10 Essential (primary) hypertension: Secondary | ICD-10-CM

## 2024-02-09 DIAGNOSIS — M1A09X1 Idiopathic chronic gout, multiple sites, with tophus (tophi): Secondary | ICD-10-CM

## 2024-02-09 DIAGNOSIS — Z1211 Encounter for screening for malignant neoplasm of colon: Secondary | ICD-10-CM

## 2024-02-09 DIAGNOSIS — R000 Tachycardia, unspecified: Secondary | ICD-10-CM

## 2024-02-09 DIAGNOSIS — E7800 Pure hypercholesterolemia, unspecified: Secondary | ICD-10-CM

## 2024-02-09 DIAGNOSIS — R808 Other proteinuria: Secondary | ICD-10-CM

## 2024-02-09 DIAGNOSIS — N1832 Chronic kidney disease, stage 3b: Secondary | ICD-10-CM

## 2024-02-09 MED ORDER — LOSARTAN POTASSIUM 100 MG PO TABS
100.0000 mg | ORAL_TABLET | Freq: Every day | ORAL | 1 refills | Status: DC
  Filled 2024-02-09 – 2024-05-13 (×2): qty 90, 90d supply, fill #0

## 2024-02-09 MED ORDER — AMLODIPINE BESYLATE 10 MG PO TABS
10.0000 mg | ORAL_TABLET | Freq: Every day | ORAL | 1 refills | Status: DC
  Filled 2024-02-09: qty 90, 90d supply, fill #0
  Filled 2024-05-13: qty 90, 90d supply, fill #1

## 2024-02-09 NOTE — Progress Notes (Signed)
Assessment & Plan:  Nathan Rubio was seen today for medical management of chronic issues.  Diagnoses and all orders for this visit:  Essential hypertension -     amLODipine (NORVASC) 10 MG tablet; Take 1 tablet (10 mg total) by mouth daily. -     losartan (COZAAR) 100 MG tablet; Take 1 tablet (100 mg total) by mouth daily. -     EKG 12-Lead  Stage 3b chronic kidney disease (HCC) -     losartan (COZAAR) 100 MG tablet; Take 1 tablet (100 mg total) by mouth daily. -     CMP14+EGFR  Colon cancer screening -     Fecal occult blood, imunochemical([facility])  Tachycardia -     Thyroid Panel With TSH -     CMP14+EGFR -     CBC with Differential  Idiopathic chronic gout of multiple sites with tophus -     Uric Acid  Hypercholesterolemia -     Lipid panel  Other proteinuria -     Urinalysis, Complete    Patient has been counseled on age-appropriate routine health concerns for screening and prevention. These are reviewed and up-to-date. Referrals have been placed accordingly. Immunizations are up-to-date or declined.    Subjective:   Chief Complaint  Patient presents with   Medical Management of Chronic Issues    Nathan Rubio 53 y.o. male presents to office today for follow up to HTN  he has a past medical history of Gout, subclinical hyperthyroidism and Inflammatory polyarthritis (HCC).    Blood pressure is low normal today. He had been taking double dose of losartan because the pills were similar in size and shape and he did not realize he had picked up the same bottle of medicine from the pharmacy. He took losartan 200 mg for about a week before he realized he was missing his amlodipine 10 mg. Over the past week and a half he has corrected his mistake and is currently taking amlodipine and losartan as prescribed. Heart rate is elevated today. He is asymptomatic. Has history of low TSH. Will recheck thyroid levels today.  BP Readings from Last 3 Encounters:  02/09/24  102/67  09/12/23 118/84  01/31/23 121/80      Review of Systems  Constitutional:  Negative for fever, malaise/fatigue and weight loss.  HENT: Negative.  Negative for nosebleeds.   Eyes: Negative.  Negative for blurred vision, double vision and photophobia.  Respiratory: Negative.  Negative for cough and shortness of breath.   Cardiovascular: Negative.  Negative for chest pain, palpitations and leg swelling.  Gastrointestinal: Negative.  Negative for heartburn, nausea and vomiting.  Musculoskeletal: Negative.  Negative for myalgias.  Neurological: Negative.  Negative for dizziness, focal weakness, seizures and headaches.  Psychiatric/Behavioral: Negative.  Negative for suicidal ideas.     Past Medical History:  Diagnosis Date   Gout    Inflammatory polyarthritis (HCC)     No past surgical history on file.  Family History  Problem Relation Age of Onset   Hypertension Sister    Depression Neg Hx    Diabetes Neg Hx     Social History Reviewed with no changes to be made today.   Outpatient Medications Prior to Visit  Medication Sig Dispense Refill   allopurinol (ZYLOPRIM) 300 MG tablet Take 2 tablets (600 mg total) by mouth daily. 180 tablet 3   losartan (COZAAR) 100 MG tablet Take 1 tablet (100 mg total) by mouth daily. 90 tablet 1   colchicine 0.6 MG tablet  Take 1 tablet (0.6 mg total) by mouth daily as needed (for gout flare). (Patient not taking: Reported on 02/09/2024) 20 tablet 1   amLODipine (NORVASC) 10 MG tablet Take 1 tablet (10 mg total) by mouth daily. (Patient not taking: Reported on 02/09/2024) 30 tablet 0   No facility-administered medications prior to visit.    No Known Allergies     Objective:    BP 102/67 (BP Location: Left Arm, Patient Position: Sitting, Cuff Size: Normal)   Pulse (!) 116   Resp 19   Ht 5\' 6"  (1.676 m)   SpO2 96%   BMI 30.83 kg/m  Wt Readings from Last 3 Encounters:  09/12/23 191 lb (86.6 kg)  09/06/22 188 lb 9.6 oz (85.5 kg)   07/05/22 187 lb 3.2 oz (84.9 kg)    Physical Exam Vitals and nursing note reviewed.  Constitutional:      Appearance: He is well-developed.  HENT:     Head: Normocephalic and atraumatic.  Cardiovascular:     Rate and Rhythm: Regular rhythm. Tachycardia present.     Heart sounds: Normal heart sounds. No murmur heard.    No friction rub. No gallop.  Pulmonary:     Effort: Pulmonary effort is normal. No tachypnea or respiratory distress.     Breath sounds: Normal breath sounds. No decreased breath sounds, wheezing, rhonchi or rales.  Chest:     Chest wall: No tenderness.  Abdominal:     General: Bowel sounds are normal.     Palpations: Abdomen is soft.  Musculoskeletal:        General: Normal range of motion.     Cervical back: Normal range of motion.  Skin:    General: Skin is warm and dry.  Neurological:     Mental Status: He is alert and oriented to person, place, and time.     Coordination: Coordination normal.  Psychiatric:        Behavior: Behavior normal. Behavior is cooperative.        Thought Content: Thought content normal.        Judgment: Judgment normal.          Patient has been counseled extensively about nutrition and exercise as well as the importance of adherence with medications and regular follow-up. The patient was given clear instructions to go to ER or return to medical center if symptoms don't improve, worsen or new problems develop. The patient verbalized understanding.   Follow-up: Return in about 3 months (around 05/08/2024).   Claiborne Rigg, FNP-BC [facility] [place], [place] [phone]   02/09/2024, 3:06 PM

## 2024-02-10 ENCOUNTER — Other Ambulatory Visit: Payer: Self-pay

## 2024-02-10 LAB — URINALYSIS, COMPLETE
Bilirubin, UA: NEGATIVE
Glucose, UA: NEGATIVE
Leukocytes,UA: NEGATIVE
Nitrite, UA: NEGATIVE
Specific Gravity, UA: 1.021 (ref 1.005–1.030)
Urobilinogen, Ur: 0.2 mg/dL (ref 0.2–1.0)
pH, UA: 5.5 (ref 5.0–7.5)

## 2024-02-10 LAB — CMP14+EGFR
ALT: 14 [IU]/L (ref 0–44)
AST: 19 [IU]/L (ref 0–40)
Albumin: 4.9 g/dL (ref 3.8–4.9)
Alkaline Phosphatase: 111 [IU]/L (ref 44–121)
BUN/Creatinine Ratio: 12 (ref 9–20)
BUN: 23 mg/dL (ref 6–24)
Bilirubin Total: 0.3 mg/dL (ref 0.0–1.2)
CO2: 21 mmol/L (ref 20–29)
Calcium: 9.9 mg/dL (ref 8.7–10.2)
Chloride: 104 mmol/L (ref 96–106)
Creatinine, Ser: 1.87 mg/dL — ABNORMAL HIGH (ref 0.76–1.27)
Globulin, Total: 2.8 g/dL (ref 1.5–4.5)
Glucose: 77 mg/dL (ref 70–99)
Potassium: 4.4 mmol/L (ref 3.5–5.2)
Sodium: 144 mmol/L (ref 134–144)
Total Protein: 7.7 g/dL (ref 6.0–8.5)
eGFR: 43 mL/min/{1.73_m2} — ABNORMAL LOW (ref >59)

## 2024-02-10 LAB — MICROSCOPIC EXAMINATION
Bacteria, UA: NONE SEEN
Casts: NONE SEEN /[LPF]
Epithelial Cells (non renal): NONE SEEN /[HPF] (ref 0–10)
WBC, UA: NONE SEEN /[HPF] (ref 0–5)

## 2024-02-10 LAB — CBC WITH DIFFERENTIAL/PLATELET
Basophils Absolute: 0.1 10*3/uL (ref 0.0–0.2)
Basos: 1 %
EOS (ABSOLUTE): 0.1 10*3/uL (ref 0.0–0.4)
Eos: 1 %
Hematocrit: 53.9 % — ABNORMAL HIGH (ref 37.5–51.0)
Hemoglobin: 18.2 g/dL — ABNORMAL HIGH (ref 13.0–17.7)
Immature Grans (Abs): 0 10*3/uL (ref 0.0–0.1)
Immature Granulocytes: 0 %
Lymphocytes Absolute: 1.4 10*3/uL (ref 0.7–3.1)
Lymphs: 15 %
MCH: 31.2 pg (ref 26.6–33.0)
MCHC: 33.8 g/dL (ref 31.5–35.7)
MCV: 92 fL (ref 79–97)
Monocytes Absolute: 0.8 10*3/uL (ref 0.1–0.9)
Monocytes: 8 %
Neutrophils Absolute: 6.8 10*3/uL (ref 1.4–7.0)
Neutrophils: 75 %
Platelets: 298 10*3/uL (ref 150–450)
RBC: 5.84 x10E6/uL — ABNORMAL HIGH (ref 4.14–5.80)
RDW: 13.3 % (ref 11.6–15.4)
WBC: 9.1 10*3/uL (ref 3.4–10.8)

## 2024-02-10 LAB — LIPID PANEL
Chol/HDL Ratio: 5.8 {ratio} — ABNORMAL HIGH (ref 0.0–5.0)
Cholesterol, Total: 243 mg/dL — ABNORMAL HIGH (ref 100–199)
HDL: 42 mg/dL (ref >39)
LDL Chol Calc (NIH): 137 mg/dL — ABNORMAL HIGH (ref 0–99)
Triglycerides: 354 mg/dL — ABNORMAL HIGH (ref 0–149)
VLDL Cholesterol Cal: 64 mg/dL — ABNORMAL HIGH (ref 5–40)

## 2024-02-10 LAB — THYROID PANEL WITH TSH
Free Thyroxine Index: 1.3 (ref 1.2–4.9)
T3 Uptake Ratio: 29 % (ref 24–39)
T4, Total: 4.6 ug/dL (ref 4.5–12.0)
TSH: 0.328 u[IU]/mL — ABNORMAL LOW (ref 0.450–4.500)

## 2024-02-10 LAB — URIC ACID: Uric Acid: 5.7 mg/dL (ref 3.8–8.4)

## 2024-02-22 ENCOUNTER — Other Ambulatory Visit: Payer: Self-pay | Admitting: Nurse Practitioner

## 2024-02-22 DIAGNOSIS — E781 Pure hyperglyceridemia: Secondary | ICD-10-CM

## 2024-02-22 MED ORDER — ROSUVASTATIN CALCIUM 20 MG PO TABS
20.0000 mg | ORAL_TABLET | Freq: Every day | ORAL | 3 refills | Status: AC
  Filled 2024-02-22: qty 90, 90d supply, fill #0

## 2024-02-23 ENCOUNTER — Other Ambulatory Visit: Payer: Self-pay

## 2024-02-24 ENCOUNTER — Other Ambulatory Visit: Payer: Self-pay | Admitting: Nurse Practitioner

## 2024-02-24 ENCOUNTER — Telehealth: Payer: Self-pay

## 2024-02-24 DIAGNOSIS — N1832 Chronic kidney disease, stage 3b: Secondary | ICD-10-CM

## 2024-02-24 DIAGNOSIS — E0590 Thyrotoxicosis, unspecified without thyrotoxic crisis or storm: Secondary | ICD-10-CM

## 2024-02-24 NOTE — Telephone Encounter (Signed)
 Pt was called and is aware of results, DOB was confirmed.  ?

## 2024-02-24 NOTE — Telephone Encounter (Signed)
-----   Message from Claiborne Rigg sent at 02/22/2024  9:21 AM EST ----- Thyroid gland is producing too much thyroid hormone. Needs to see endocrinology. Will place referral. This can be very serious. He will need to see if he can apply for the financial assistance through the clinic if he can not afford the endocrinologist.   He has stage 3 kidney disease as well as too much protein and microscopic blood in his urine and should be seeing the kidney doctor. If he is not I can place another referral. Is he seeing the kidney doctor?   Cholesterol levels significantly elevated. Needs to be on cholesterol lowering medication. I will send to pharmacy.   Normal uric acid level

## 2024-03-01 ENCOUNTER — Other Ambulatory Visit: Payer: Self-pay

## 2024-05-10 ENCOUNTER — Ambulatory Visit: Payer: Self-pay | Attending: Nurse Practitioner | Admitting: Nurse Practitioner

## 2024-05-10 ENCOUNTER — Encounter: Payer: Self-pay | Admitting: Nurse Practitioner

## 2024-05-10 VITALS — BP 107/69 | HR 70 | Resp 19 | Ht 66.0 in | Wt 190.6 lb

## 2024-05-10 DIAGNOSIS — I10 Essential (primary) hypertension: Secondary | ICD-10-CM

## 2024-05-10 DIAGNOSIS — R7989 Other specified abnormal findings of blood chemistry: Secondary | ICD-10-CM

## 2024-05-10 NOTE — Progress Notes (Signed)
 Assessment & Plan:  Nathan Rubio was seen today for hypertension.  Diagnoses and all orders for this visit:  Primary hypertension -     CMP14+EGFR Continue all antihypertensives as prescribed.  Reminded to bring in blood pressure log for follow  up appointment.  RECOMMENDATIONS: DASH/Mediterranean Diets are healthier choices for HTN.    Elevated serum creatinine May start on Jardiance or Farxiga based on lab results. -     CMP14+EGFR    Patient has been counseled on age-appropriate routine health concerns for screening and prevention. These are reviewed and up-to-date. Referrals have been placed accordingly. Immunizations are up-to-date or declined.    Subjective:   Chief Complaint  Patient presents with   Hypertension    Nathan Rubio 53 y.o. male presents to office today for follow up to HTN  Past medical history of Gout, HTN, subclinical hyperthyroidism and Inflammatory polyarthritis, CKD stage 3b.   He has been instructed to follow-up with endocrinology for subclinical hyperthyroidism and with nephrology for chronic kidney disease.  He is currently uninsured and has been instructed to apply for the financial assistance program.  They were instructed to inquire at the front desk about the application process for the [place] discount, orange card or other financial assistance.    Aaron Aas     HTN Blood pressure is well-controlled with amlodipine  10 mg daily and losartan  100 mg daily. BP Readings from Last 3 Encounters:  05/10/24 107/69  02/09/24 102/67  09/12/23 118/84     Review of Systems  Constitutional:  Negative for fever, malaise/fatigue and weight loss.  HENT: Negative.  Negative for nosebleeds.   Eyes: Negative.  Negative for blurred vision, double vision and photophobia.  Respiratory: Negative.  Negative for cough and shortness of breath.   Cardiovascular: Negative.  Negative for chest pain, palpitations and leg swelling.  Gastrointestinal: Negative.  Negative  for heartburn, nausea and vomiting.  Musculoskeletal: Negative.  Negative for myalgias.  Neurological: Negative.  Negative for dizziness, focal weakness, seizures and headaches.  Psychiatric/Behavioral: Negative.  Negative for suicidal ideas.     Past Medical History:  Diagnosis Date   Gout    Inflammatory polyarthritis (HCC)     History reviewed. No pertinent surgical history.  Family History  Problem Relation Age of Onset   Hypertension Sister    Depression Neg Hx    Diabetes Neg Hx     Social History Reviewed with no changes to be made today.   Outpatient Medications Prior to Visit  Medication Sig Dispense Refill   allopurinol  (ZYLOPRIM ) 300 MG tablet Take 2 tablets (600 mg total) by mouth daily. 180 tablet 3   amLODipine  (NORVASC ) 10 MG tablet Take 1 tablet (10 mg total) by mouth daily. 90 tablet 1   colchicine  0.6 MG tablet Take 1 tablet (0.6 mg total) by mouth daily as needed (for gout flare). 20 tablet 1   losartan  (COZAAR ) 100 MG tablet Take 1 tablet (100 mg total) by mouth daily. 90 tablet 1   rosuvastatin  (CRESTOR ) 20 MG tablet Take 1 tablet (20 mg total) by mouth daily. (Patient not taking: Reported on 05/10/2024) 90 tablet 3   No facility-administered medications prior to visit.    No Known Allergies     Objective:    BP 107/69 (BP Location: Left Arm, Patient Position: Sitting, Cuff Size: Normal)   Pulse 70   Resp 19   Ht 5\' 6"  (1.676 m)   Wt 190 lb 9.6 oz (86.5 kg)   SpO2  100%   BMI 30.76 kg/m  Wt Readings from Last 3 Encounters:  05/10/24 190 lb 9.6 oz (86.5 kg)  02/09/24 191 lb 6.4 oz (86.8 kg)  09/12/23 191 lb (86.6 kg)    Physical Exam Vitals and nursing note reviewed.  Constitutional:      Appearance: He is well-developed.  HENT:     Head: Normocephalic and atraumatic.  Cardiovascular:     Rate and Rhythm: Normal rate and regular rhythm.     Heart sounds: Normal heart sounds. No murmur heard.    No friction rub. No gallop.  Pulmonary:      Effort: Pulmonary effort is normal. No tachypnea or respiratory distress.     Breath sounds: Normal breath sounds. No decreased breath sounds, wheezing, rhonchi or rales.  Chest:     Chest wall: No tenderness.  Abdominal:     General: Bowel sounds are normal.     Palpations: Abdomen is soft.  Musculoskeletal:        General: Normal range of motion.     Cervical back: Normal range of motion.  Skin:    General: Skin is warm and dry.  Neurological:     Mental Status: He is alert and oriented to person, place, and time.     Coordination: Coordination normal.  Psychiatric:        Behavior: Behavior normal. Behavior is cooperative.        Thought Content: Thought content normal.        Judgment: Judgment normal.          Patient has been counseled extensively about nutrition and exercise as well as the importance of adherence with medications and regular follow-up. The patient was given clear instructions to go to ER or return to medical center if symptoms don't improve, worsen or new problems develop. The patient verbalized understanding.   Follow-up: Return in about 3 months (around 08/10/2024).   Collins Dean, FNP-BC [facility] [place], [place] [phone]   05/10/2024, 2:58 PM

## 2024-05-10 NOTE — Patient Instructions (Addendum)
 [facility] Address: [address] [place], [place] [address] Phone: [phone]  [facility] Endocrinology for thyroid  WQ Ph# [phone] Fax # [phone]. [address] [place], [place] [address]

## 2024-05-11 LAB — CMP14+EGFR
ALT: 16 IU/L (ref 0–44)
AST: 25 IU/L (ref 0–40)
Albumin: 4.8 g/dL (ref 3.8–4.9)
Alkaline Phosphatase: 109 IU/L (ref 44–121)
BUN/Creatinine Ratio: 14 (ref 9–20)
BUN: 26 mg/dL — ABNORMAL HIGH (ref 6–24)
Bilirubin Total: 0.3 mg/dL (ref 0.0–1.2)
CO2: 16 mmol/L — ABNORMAL LOW (ref 20–29)
Calcium: 9.8 mg/dL (ref 8.7–10.2)
Chloride: 105 mmol/L (ref 96–106)
Creatinine, Ser: 1.83 mg/dL — ABNORMAL HIGH (ref 0.76–1.27)
Globulin, Total: 2.6 g/dL (ref 1.5–4.5)
Glucose: 82 mg/dL (ref 70–99)
Potassium: 5 mmol/L (ref 3.5–5.2)
Sodium: 140 mmol/L (ref 134–144)
Total Protein: 7.4 g/dL (ref 6.0–8.5)
eGFR: 44 mL/min/{1.73_m2} — ABNORMAL LOW (ref >59)

## 2024-05-13 ENCOUNTER — Other Ambulatory Visit: Payer: Self-pay

## 2024-05-13 ENCOUNTER — Other Ambulatory Visit (HOSPITAL_COMMUNITY): Payer: Self-pay

## 2024-05-17 ENCOUNTER — Ambulatory Visit: Payer: Self-pay | Admitting: Nurse Practitioner

## 2024-06-01 ENCOUNTER — Ambulatory Visit: Payer: Self-pay | Attending: Internal Medicine | Admitting: Internal Medicine

## 2024-06-01 ENCOUNTER — Ambulatory Visit: Payer: Self-pay

## 2024-06-01 ENCOUNTER — Encounter: Payer: Self-pay | Admitting: Internal Medicine

## 2024-06-01 ENCOUNTER — Other Ambulatory Visit: Payer: Self-pay

## 2024-06-01 VITALS — BP 101/66 | HR 96 | Temp 98.2°F | Ht 66.0 in | Wt 192.0 lb

## 2024-06-01 DIAGNOSIS — L259 Unspecified contact dermatitis, unspecified cause: Secondary | ICD-10-CM

## 2024-06-01 MED ORDER — TRIAMCINOLONE ACETONIDE 0.1 % EX CREA
1.0000 | TOPICAL_CREAM | Freq: Two times a day (BID) | CUTANEOUS | 0 refills | Status: AC
  Filled 2024-06-01: qty 30, 15d supply, fill #0

## 2024-06-01 MED ORDER — PREDNISONE 10 MG PO TABS
10.0000 mg | ORAL_TABLET | Freq: Every day | ORAL | 0 refills | Status: DC
  Filled 2024-06-01: qty 5, 5d supply, fill #0

## 2024-06-01 NOTE — Patient Instructions (Signed)
 VISIT SUMMARY:  You came in today because of a rash on your abdomen and legs that started three weeks ago. The rash began on your left leg and has spread to other areas, becoming itchy and more pronounced over time. You have been using over-the-counter hydrocortisone cream without improvement. You have no fever, and no one else in your household has a similar rash. You are currently taking allopurinol  for gout.  YOUR PLAN:  -ALLERGIC CONTACT DERMATITIS: Allergic contact dermatitis is a skin reaction caused by exposure to an allergen, such as poison ivy. You have been prescribed triamcinolone cream to apply to the affected areas and oral prednisone  to take for 3-5 days. Please avoid scratching the rash to prevent it from spreading. If there is no improvement please let us  know as you will need to be referred to a dermatologist.  INSTRUCTIONS:  Please follow up with a dermatologist if there is no improvement in your rash after using the prescribed medications. Continue taking your allopurinol  as directed for gout.

## 2024-06-01 NOTE — Progress Notes (Signed)
 Patient ID: [name], male    DOB: [birth year]  MRN: [id number]  CC: Rash (Rash on L leg spreading to abdomen X3 weeks, itchy)   Subjective: Nathan Rubio is a 53 y.o. male who presents for acute visit. PCP is Winda Hastings His concerns today include:  Pt with hx of HTN, CKD3, gout  Discussed the use of AI scribe software for clinical note transcription with the patient, who gave verbal consent to proceed.  History of Present Illness Nathan Rubio is a 53 year old male who presents with a rash on the abdomen and legs.  The rash began on the left lower leg anteriorly three weeks ago, appearing as small, itchy lesions similar to mosquito bites. It has since spread to a few spots on the right leg, upper legs, and the left lower abdomen, with the abdominal rash appearing yesterday. The rash is persistently itchy and has not improved with over-the-counter hydrocortisone cream. Initially not very red, the rash has become more pronounced, possibly due to scratching.  There is no recollection of a specific incident causing the rash but notes that he was outside in the yard with some friends probably a day or 2 before the rash started and had mowed the lawn. No fever is present, and no similar rash is noted in household members. He has not traveled long distances recently.  He has a dog and the dog gets flea/tick medications on schedule.      Patient Active Problem List   Diagnosis Date Noted   CKD (chronic kidney disease) stage 3, GFR 30-59 ml/min (HCC) 03/20/2020   Essential hypertension 03/20/2020   Idiopathic chronic gout of multiple sites with tophus 03/20/2020     Current Outpatient Medications on File Prior to Visit  Medication Sig Dispense Refill   allopurinol  (ZYLOPRIM ) 300 MG tablet Take 2 tablets (600 mg total) by mouth daily. 180 tablet 3   amLODipine  (NORVASC ) 10 MG tablet Take 1 tablet (10 mg total) by mouth daily. 90 tablet 1   colchicine  0.6 MG tablet Take 1  tablet (0.6 mg total) by mouth daily as needed (for gout flare). 20 tablet 1   losartan  (COZAAR ) 100 MG tablet Take 1 tablet (100 mg total) by mouth daily. 90 tablet 1   rosuvastatin  (CRESTOR ) 20 MG tablet Take 1 tablet (20 mg total) by mouth daily. 90 tablet 3   No current facility-administered medications on file prior to visit.    No Known Allergies  Social History   Socioeconomic History   Marital status: Single    Spouse name: Not on file   Number of children: Not on file   Years of education: Not on file   Highest education level: Not on file  Occupational History   Not on file  Tobacco Use   Smoking status: Never    Passive exposure: Never   Smokeless tobacco: Never  Vaping Use   Vaping status: Never Used  Substance and Sexual Activity   Alcohol use: Not Currently   Drug use: Never   Sexual activity: Not Currently  Other Topics Concern   Not on file  Social History Narrative   Not on file   Social Drivers of Health   Financial Resource Strain: Medium Risk (06/01/2024)   Overall Financial Resource Strain (CARDIA)    Difficulty of Paying Living Expenses: Somewhat hard  Food Insecurity: No Food Insecurity (06/01/2024)   Hunger Vital Sign    Worried About Running Out of Food in  the Last Year: Never true    Ran Out of Food in the Last Year: Never true  Transportation Needs: No Transportation Needs (06/01/2024)   PRAPARE - Administrator, Civil Service (Medical): No    Lack of Transportation (Non-Medical): No  Physical Activity: Insufficiently Active (06/01/2024)   Exercise Vital Sign    Days of Exercise per Week: 2 days    Minutes of Exercise per Session: 30 min  Stress: No Stress Concern Present (06/01/2024)   [employer] of Occupational Health - Occupational Stress Questionnaire    Feeling of Stress : Not at all  Social Connections: Moderately Isolated (06/01/2024)   Social Connection and Isolation Panel [NHANES]    Frequency of Communication  with Friends and Family: More than three times a week    Frequency of Social Gatherings with Friends and Family: Once a week    Attends Religious Services: Never    Database administrator or Organizations: No    Attends Banker Meetings: Never    Marital Status: Living with partner  Intimate Partner Violence: Not At Risk (06/01/2024)   Humiliation, Afraid, Rape, and Kick questionnaire    Fear of Current or Ex-Partner: No    Emotionally Abused: No    Physically Abused: No    Sexually Abused: No    Family History  Problem Relation Age of Onset   Hypertension Sister    Depression Neg Hx    Diabetes Neg Hx     No past surgical history on file.  ROS: Review of Systems Negative except as stated above  PHYSICAL EXAM: BP 101/66 (BP Location: Left Arm, Patient Position: Sitting, Cuff Size: Normal)   Pulse 96   Temp 98.2 F (36.8 C) (Oral)   Ht 5\' 6"  (1.676 m)   Wt 192 lb (87.1 kg)   SpO2 97%   BMI 30.99 kg/m   Physical Exam   General appearance - alert, well appearing, and in no distress Mental status - normal mood, behavior, speech, dress, motor activity, and thought processes Skin -patient with macular erythematous rash prominent on the left lower leg anteriorly.  Few red spots noted on the right leg and the left lower abdomen     Latest Ref Rng & Units 05/10/2024    2:59 PM 02/09/2024    3:15 PM 09/06/2022   12:10 PM  CMP  Glucose 70 - 99 mg/dL 82  77  161   BUN 6 - 24 mg/dL 26  23  26    Creatinine 0.76 - 1.27 mg/dL 0.96  0.45  4.09   Sodium 134 - 144 mmol/L 140  144  138   Potassium 3.5 - 5.2 mmol/L 5.0  4.4  4.7   Chloride 96 - 106 mmol/L 105  104  104   CO2 20 - 29 mmol/L 16  21  25    Calcium  8.7 - 10.2 mg/dL 9.8  9.9  81.1   Total Protein 6.0 - 8.5 g/dL 7.4  7.7    Total Bilirubin 0.0 - 1.2 mg/dL 0.3  0.3    Alkaline Phos 44 - 121 IU/L 109  111    AST 0 - 40 IU/L 25  19    ALT 0 - 44 IU/L 16  14     Lipid Panel     Component Value Date/Time    CHOL 243 (H) 02/09/2024 1515   TRIG 354 (H) 02/09/2024 1515   HDL 42 02/09/2024 1515   CHOLHDL 5.8 (H) 02/09/2024  1515   CHOLHDL 3.3 Ratio 04/03/2010 2156   VLDL 23 04/03/2010 2156   LDLCALC 137 (H) 02/09/2024 1515    CBC    Component Value Date/Time   WBC 9.1 02/09/2024 1515   WBC 4.9 05/16/2010 2053   RBC 5.84 (H) 02/09/2024 1515   RBC 5.28 05/16/2010 2053   HGB 18.2 (H) 02/09/2024 1515   HCT 53.9 (H) 02/09/2024 1515   PLT 298 02/09/2024 1515   MCV 92 02/09/2024 1515   MCH 31.2 02/09/2024 1515   MCHC 33.8 02/09/2024 1515   MCHC 33.2 05/16/2010 2053   RDW 13.3 02/09/2024 1515   LYMPHSABS 1.4 02/09/2024 1515   MONOABS 0.3 05/16/2010 2053   EOSABS 0.1 02/09/2024 1515   BASOSABS 0.1 02/09/2024 1515    ASSESSMENT AND PLAN:  Assessment and Plan Assessment & Plan Allergic Contact Dermatitis Pruritic erythematous rash likely due to allergen exposure, possibly poison ivy. Non-responsive to OTC hydrocortisone. Discussed triamcinolone and prednisone  use. - Prescribe triamcinolone cream for affected areas. - Prescribe oral prednisone  for 3-5 days. - Advise against scratching to prevent spread. - Refer to dermatologist if no improvement.    Patient was given the opportunity to ask questions.  Patient verbalized understanding of the plan and was able to repeat key elements of the plan.   This documentation was completed using Paediatric nurse.  Any transcriptional errors are unintentional.  No orders of the defined types were placed in this encounter.    Requested Prescriptions   Signed Prescriptions Disp Refills   predniSONE  (DELTASONE ) 10 MG tablet 5 tablet 0    Sig: Take 1 tablet (10 mg total) by mouth daily with breakfast.   triamcinolone cream (KENALOG) 0.1 % 30 g 0    Sig: Apply 1 Application topically 2 (two) times daily.    Return if symptoms worsen or fail to improve.  Concetta Dee, MD, FACP

## 2024-06-01 NOTE — Telephone Encounter (Signed)
 FYI Only or Action Required?: FYI only for provider  Patient was last seen in primary care on 05/10/2024 by Collins Dean, NP. Called Nurse Triage reporting Rash. Symptoms began several weeks ago. Interventions attempted: Rest, hydration, or home remedies. Symptoms are: gradually worsening.  Triage Disposition: See Physician Within 24 Hours  Patient/caregiver understands and will follow disposition?: Yes                      Copied from CRM [id number]. Topic: Clinical - Red Word Triage >> Jun 01, 2024 11:43 AM Carlatta H wrote: [facility] that prompted transfer to Nurse Triage: Patient has a rash on left leg//Red and itchy//Rash for about 3 week and nothing over the counter helps Reason for Disposition  SEVERE itching (i.e., interferes with sleep, normal activities or school)  Answer Assessment - Initial Assessment Questions 1. APPEARANCE of RASH: "Describe the rash." (e.g., spots, blisters, raised areas, skin peeling, scaly)     Started on his L leg, few days ago he noticed it's on his stomach now; "it's red, I thought it was mosquito bites, when you touch it it's very itchy" 2. SIZE: "How big are the spots?" (e.g., tip of pen, eraser, coin; inches, centimeters)     L leg, front of his leg  3. LOCATION: "Where is the rash located?"     L leg, stomach  4. COLOR: "What color is the rash?" (Note: It is difficult to assess rash color in people with darker-colored skin. When this situation occurs, simply ask the caller to describe what they see.)     Red  5. ONSET: "When did the rash begin?"     3 wks 6. FEVER: "Do you have a fever?" If Yes, ask: "What is your temperature, how was it measured, and when did it start?"     No  7. ITCHING: "Does the rash itch?" If Yes, ask: "How bad is the itch?" (Scale 1-10; or mild, moderate, severe)     8/10 8. CAUSE: "What do you think is causing the rash?"     Not sure  9. MEDICINE FACTORS: "Have you started any new medicines within the  last 2 weeks?" (e.g., antibiotics)      No  10. OTHER SYMPTOMS: "Do you have any other symptoms?" (e.g., dizziness, headache, sore throat, joint pain)         No drainage. Denies heat. Denies other symptoms  Protocols used: Rash or Redness - [facility]

## 2024-06-02 ENCOUNTER — Other Ambulatory Visit: Payer: Self-pay

## 2024-06-10 ENCOUNTER — Other Ambulatory Visit: Payer: Self-pay | Admitting: Nurse Practitioner

## 2024-06-10 ENCOUNTER — Other Ambulatory Visit: Payer: Self-pay

## 2024-06-10 DIAGNOSIS — I10 Essential (primary) hypertension: Secondary | ICD-10-CM

## 2024-06-10 MED ORDER — AMLODIPINE BESYLATE 10 MG PO TABS
10.0000 mg | ORAL_TABLET | Freq: Every day | ORAL | 0 refills | Status: DC
  Filled 2024-06-10: qty 90, 90d supply, fill #0

## 2024-06-14 ENCOUNTER — Other Ambulatory Visit: Payer: Self-pay

## 2024-08-10 ENCOUNTER — Encounter: Payer: Self-pay | Admitting: Nurse Practitioner

## 2024-08-10 ENCOUNTER — Ambulatory Visit: Payer: Self-pay | Attending: Nurse Practitioner | Admitting: Nurse Practitioner

## 2024-08-10 ENCOUNTER — Other Ambulatory Visit: Payer: Self-pay

## 2024-08-10 VITALS — BP 109/70 | HR 98 | Resp 19 | Ht 66.0 in | Wt 194.6 lb

## 2024-08-10 DIAGNOSIS — I10 Essential (primary) hypertension: Secondary | ICD-10-CM

## 2024-08-10 DIAGNOSIS — R7989 Other specified abnormal findings of blood chemistry: Secondary | ICD-10-CM

## 2024-08-10 DIAGNOSIS — D72829 Elevated white blood cell count, unspecified: Secondary | ICD-10-CM

## 2024-08-10 DIAGNOSIS — I129 Hypertensive chronic kidney disease with stage 1 through stage 4 chronic kidney disease, or unspecified chronic kidney disease: Secondary | ICD-10-CM

## 2024-08-10 DIAGNOSIS — N1832 Chronic kidney disease, stage 3b: Secondary | ICD-10-CM

## 2024-08-10 DIAGNOSIS — M1A9XX1 Chronic gout, unspecified, with tophus (tophi): Secondary | ICD-10-CM

## 2024-08-10 MED ORDER — ALLOPURINOL 300 MG PO TABS
600.0000 mg | ORAL_TABLET | Freq: Every day | ORAL | 3 refills | Status: AC
  Filled 2024-08-10 – 2024-10-24 (×2): qty 180, 90d supply, fill #0

## 2024-08-10 MED ORDER — LOSARTAN POTASSIUM 100 MG PO TABS
100.0000 mg | ORAL_TABLET | Freq: Every day | ORAL | 1 refills | Status: AC
  Filled 2024-08-10: qty 90, 90d supply, fill #0
  Filled 2024-12-22: qty 90, 90d supply, fill #1

## 2024-08-10 NOTE — Progress Notes (Signed)
 Assessment & Plan:  [name] was seen today for hypertension.  Diagnoses and all orders for this visit:  Primary hypertension -     losartan  (COZAAR ) 100 MG tablet; Take 1 tablet (100 mg total) by mouth daily. -     CMP14+EGFR Continue all antihypertensives as prescribed.  Reminded to bring in blood pressure log for follow  up appointment.  RECOMMENDATIONS: DASH/Mediterranean Diets are healthier choices for HTN.    Chronic gout with tophus, unspecified cause, unspecified site -     allopurinol  (ZYLOPRIM ) 300 MG tablet; Take 2 tablets (600 mg total) by mouth daily.  Stage 3b chronic kidney disease (HCC) -     losartan  (COZAAR ) 100 MG tablet; Take 1 tablet (100 mg total) by mouth daily. -     CMP14+EGFR  Abnormal TSH -     Thyroid  Panel With TSH  Leukocytosis, unspecified type -     CBC with Differential    Patient has been counseled on age-appropriate routine health concerns for screening and prevention. These are reviewed and up-to-date. Referrals have been placed accordingly. Immunizations are up-to-date or declined.    Subjective:   Chief Complaint  Patient presents with   Hypertension    Nathan Rubio 53 y.o. male presents to office today for follow up to HTN  He has a past medical history of HTN, Gout and Inflammatory polyarthritis (HCC).    Blood pressure is well controlled. He is taking losartan  100 mg daily and amlodipine  10 mg daily.  BP Readings from Last 3 Encounters:  08/10/24 109/70  06/01/24 101/66  05/10/24 107/69     Uric acid levels at goal.  Lab Results  Component Value Date   LABURIC 5.7 02/09/2024     Review of Systems  Constitutional:  Negative for fever, malaise/fatigue and weight loss.  HENT: Negative.  Negative for nosebleeds.   Eyes: Negative.  Negative for blurred vision, double vision and photophobia.  Respiratory: Negative.  Negative for cough and shortness of breath.   Cardiovascular: Negative.  Negative for chest pain,  palpitations and leg swelling.  Gastrointestinal: Negative.  Negative for heartburn, nausea and vomiting.  Musculoskeletal: Negative.  Negative for myalgias.  Neurological: Negative.  Negative for dizziness, focal weakness, seizures and headaches.  Psychiatric/Behavioral: Negative.  Negative for suicidal ideas.     Past Medical History:  Diagnosis Date   Gout    Inflammatory polyarthritis (HCC)     History reviewed. No pertinent surgical history.  Family History  Problem Relation Age of Onset   Hypertension Sister    Depression Neg Hx    Diabetes Neg Hx     Social History Reviewed with no changes to be made today.   Outpatient Medications Prior to Visit  Medication Sig Dispense Refill   colchicine  0.6 MG tablet Take 1 tablet (0.6 mg total) by mouth daily as needed (for gout flare). 20 tablet 1   allopurinol  (ZYLOPRIM ) 300 MG tablet Take 2 tablets (600 mg total) by mouth daily. 180 tablet 3   amLODipine  (NORVASC ) 10 MG tablet Take 1 tablet (10 mg total) by mouth daily. 90 tablet 0   losartan  (COZAAR ) 100 MG tablet Take 1 tablet (100 mg total) by mouth daily. 90 tablet 1   rosuvastatin  (CRESTOR ) 20 MG tablet Take 1 tablet (20 mg total) by mouth daily. (Patient not taking: Reported on 08/10/2024) 90 tablet 3   triamcinolone  cream (KENALOG ) 0.1 % Apply 1 Application topically 2 (two) times daily. (Patient not taking: Reported on 08/10/2024)  30 g 0   predniSONE  (DELTASONE ) 10 MG tablet Take 1 tablet (10 mg total) by mouth daily with breakfast. (Patient not taking: Reported on 08/10/2024) 5 tablet 0   No facility-administered medications prior to visit.    No Known Allergies     Objective:    BP 109/70 (BP Location: Left Arm, Patient Position: Sitting, Cuff Size: Normal)   Pulse 98   Resp 19   Ht 5' 6 (1.676 m)   Wt 194 lb 9.6 oz (88.3 kg)   SpO2 98%   BMI 31.41 kg/m  Wt Readings from Last 3 Encounters:  08/10/24 194 lb 9.6 oz (88.3 kg)  06/01/24 192 lb (87.1 kg)   05/10/24 190 lb 9.6 oz (86.5 kg)    Physical Exam Vitals and nursing note reviewed.  Constitutional:      Appearance: He is well-developed.  HENT:     Head: Normocephalic and atraumatic.  Cardiovascular:     Rate and Rhythm: Normal rate and regular rhythm.     Heart sounds: Normal heart sounds. No murmur heard.    No friction rub. No gallop.  Pulmonary:     Effort: Pulmonary effort is normal. No tachypnea or respiratory distress.     Breath sounds: Normal breath sounds. No decreased breath sounds, wheezing, rhonchi or rales.  Chest:     Chest wall: No tenderness.  Abdominal:     General: Bowel sounds are normal.     Palpations: Abdomen is soft.  Musculoskeletal:        General: Normal range of motion.     Cervical back: Normal range of motion.  Skin:    General: Skin is warm and dry.  Neurological:     Mental Status: He is alert and oriented to person, place, and time.     Coordination: Coordination normal.  Psychiatric:        Behavior: Behavior normal. Behavior is cooperative.        Thought Content: Thought content normal.        Judgment: Judgment normal.          Patient has been counseled extensively about nutrition and exercise as well as the importance of adherence with medications and regular follow-up. The patient was given clear instructions to go to ER or return to medical center if symptoms don't improve, worsen or new problems develop. The patient verbalized understanding.   Follow-up: Return in about 4 months (around 12/10/2024).   Haze LELON Servant, FNP-BC [facility] [place], [place] [phone]   08/30/2024, 12:26 AM

## 2024-08-11 LAB — CMP14+EGFR
ALT: 18 IU/L (ref 0–44)
AST: 25 IU/L (ref 0–40)
Albumin: 4.6 g/dL (ref 3.8–4.9)
Alkaline Phosphatase: 114 IU/L (ref 44–121)
BUN/Creatinine Ratio: 15 (ref 9–20)
BUN: 27 mg/dL — ABNORMAL HIGH (ref 6–24)
Bilirubin Total: 0.3 mg/dL (ref 0.0–1.2)
CO2: 19 mmol/L — ABNORMAL LOW (ref 20–29)
Calcium: 9.9 mg/dL (ref 8.7–10.2)
Chloride: 104 mmol/L (ref 96–106)
Creatinine, Ser: 1.78 mg/dL — ABNORMAL HIGH (ref 0.76–1.27)
Globulin, Total: 2.6 g/dL (ref 1.5–4.5)
Glucose: 83 mg/dL (ref 70–99)
Potassium: 4.1 mmol/L (ref 3.5–5.2)
Sodium: 141 mmol/L (ref 134–144)
Total Protein: 7.2 g/dL (ref 6.0–8.5)
eGFR: 45 mL/min/1.73 — ABNORMAL LOW (ref >59)

## 2024-08-11 LAB — CBC WITH DIFFERENTIAL/PLATELET
Basophils Absolute: 0.1 x10E3/uL (ref 0.0–0.2)
Basos: 1 %
EOS (ABSOLUTE): 0.1 x10E3/uL (ref 0.0–0.4)
Eos: 1 %
Hematocrit: 49.3 % (ref 37.5–51.0)
Hemoglobin: 16.2 g/dL (ref 13.0–17.7)
Immature Grans (Abs): 0 x10E3/uL (ref 0.0–0.1)
Immature Granulocytes: 0 %
Lymphocytes Absolute: 1.6 x10E3/uL (ref 0.7–3.1)
Lymphs: 20 %
MCH: 31.4 pg (ref 26.6–33.0)
MCHC: 32.9 g/dL (ref 31.5–35.7)
MCV: 96 fL (ref 79–97)
Monocytes Absolute: 0.7 x10E3/uL (ref 0.1–0.9)
Monocytes: 9 %
Neutrophils Absolute: 5.2 x10E3/uL (ref 1.4–7.0)
Neutrophils: 68 %
Platelets: 268 x10E3/uL (ref 150–450)
RBC: 5.16 x10E6/uL (ref 4.14–5.80)
RDW: 13 % (ref 11.6–15.4)
WBC: 7.8 x10E3/uL (ref 3.4–10.8)

## 2024-08-11 LAB — THYROID PANEL WITH TSH
Free Thyroxine Index: 1.4 (ref 1.2–4.9)
T3 Uptake Ratio: 30 % (ref 24–39)
T4, Total: 4.7 ug/dL (ref 4.5–12.0)
TSH: 0.257 u[IU]/mL — ABNORMAL LOW (ref 0.450–4.500)

## 2024-08-12 ENCOUNTER — Ambulatory Visit: Payer: Self-pay | Admitting: Nurse Practitioner

## 2024-08-27 NOTE — Progress Notes (Deleted)
 Office Visit Note  Patient: Nathan Rubio             Date of Birth: July 12, 1971           MRN: [id number]             PCP: Theotis Haze LELON, NP Referring: Theotis Haze LELON, NP Visit Date: 09/10/2024   Subjective:  No chief complaint on file.   History of Present Illness: Nathan Rubio is a 53 y.o. male here for follow up for chronic, polyarticular, tophaceous, erosive gout  on allopurinol  600 mg daily.   Previous HPI 09/12/2023 Nathan Rubio is a 53 y.o. male here for follow up for chronic, polyarticular, tophaceous, erosive gout  on allopurinol  600 mg daily.  Symptoms are overall doing well without daily stiffness or joint swelling. He as days intermittently with swelling, recalls 2 recent times following drinking iced coffee and after eating a large amount of watermelon.   Previous HPI 09/06/2022 Nathan Rubio is a 53 y.o. male here for follow up for chronic, polyarticular, tophaceous, erosive gout  on allopurinol  600 mg daily. He had no major flare since our last visit. He took aleve for minor pain in the right knee recently but just once dose. He had a decrease in renal function on most recent labs concerning for CKD progression or medication side effect. This was a month after increase in losartan  dose and addition of amlodipine  for incompletely controlled hypertension.   Previous HPI 03/06/2022 Nathan Rubio is a 53 y.o. male here for follow up for chronic, polyarticular, tophaceous, erosive gout on allopurinol  600 mg daily. He is taking allopurinol  600 mg daily reports pretty good adherence. He only took the colchicine  1 or 2 occasions usually has not had much pain. He has been drinking water some of the time but also still drinks a lot of soda. He had a recent vacation in [place] back since this weekend so thinks his diet was more liberal than usual recently.   Previous HPI 09/05/21 Nathan Rubio is a 53 y.o. male here for follow up for chronic, polyarticular,  tophaceous, erosive gouty arthritis on allopurinol  600 mg daily and colchicine  0.6 mg daily as needed.  Overall since last visit doing reasonably well.  He feels there have been a few minor gout flareups but no very severe episodes.  Most recently involved was left knee pain and stiffness that has improved.  He denies any trouble taking the medication.   Previous HPI 02/16/21 Nathan Rubio is a 53 y.o. male with HTN, HLD, CKD stage 3 here for evaluation and management of chronic tophaceous gout.  He has gout flares since at least 25 years ago with varying frequency but somewhere between 3-6 times per year most of the time.  More recently he is also had persistent pain and stiffness even between the specific flares.  He was treated with allopurinol  in the past but did not stay on this medicine consistently due to lack of medical follow-up. He used to take NSAIDs OTC heavily for symptoms but stopped after being found to have CKD. 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 He was previously seen with Dr. Rozella at [facility] rheumatology but not following up due to costs. 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 He has been back on allopurinol  not taking 600 mg daily for few months most recent  uric acid was at goal in November.  He is taking colchicine  as needed for flares which is usually effective within 2 to 3 days   No Rheumatology ROS completed.   PMFS History:  Patient Active Problem List   Diagnosis Date Noted   CKD (chronic kidney disease) stage 3, GFR 30-59 ml/min (HCC) 03/20/2020   Essential hypertension 03/20/2020   Idiopathic chronic gout of multiple sites with tophus 03/20/2020    Past Medical History:  Diagnosis Date   Gout    Inflammatory polyarthritis (HCC)     Family History  Problem Relation Age of Onset   Hypertension Sister    Depression Neg Hx     Diabetes Neg Hx    No past surgical history on file. Social History   Social History Narrative   Not on file   Immunization History  Administered Date(s) Administered   PFIZER(Purple Top)SARS-COV-2 Vaccination 04/14/2020, 05/14/2020, 01/05/2021     Objective: Vital Signs: There were no vitals taken for this visit.   Physical Exam   Musculoskeletal Exam: ***  CDAI Exam: CDAI Score: -- Patient Global: --; Provider Global: -- Swollen: --; Tender: -- Joint Exam 09/10/2024   No joint exam has been documented for this visit   There is currently no information documented on the homunculus. Go to the Rheumatology activity and complete the homunculus joint exam.  Investigation: No additional findings.  Imaging: No results found.  Recent Labs: Lab Results  Component Value Date   WBC 7.8 08/10/2024   HGB 16.2 08/10/2024   PLT 268 08/10/2024   NA 141 08/10/2024   K 4.1 08/10/2024   CL 104 08/10/2024   CO2 19 (L) 08/10/2024   GLUCOSE 83 08/10/2024   BUN 27 (H) 08/10/2024   CREATININE 1.78 (H) 08/10/2024   BILITOT 0.3 08/10/2024   ALKPHOS 114 08/10/2024   AST 25 08/10/2024   ALT 18 08/10/2024   PROT 7.2 08/10/2024   ALBUMIN 4.6 08/10/2024   CALCIUM  9.9 08/10/2024   GFRAA 54 (L) 02/16/2021    Speciality Comments: No specialty comments available.  Procedures:  No procedures performed Allergies: Patient has no known allergies.   Assessment / Plan:     Visit Diagnoses: No diagnosis found.  ***  Orders: No orders of the defined types were placed in this encounter.  No orders of the defined types were placed in this encounter.    Follow-Up Instructions: No follow-ups on file.   Ferdie Bakken M Tito Ausmus, CMA  Note - This record has been created using Animal nutritionist.  Chart creation errors have been sought, but may not always  have been located. Such creation errors do not reflect on  the standard of medical care.

## 2024-08-30 ENCOUNTER — Other Ambulatory Visit: Payer: Self-pay

## 2024-08-30 ENCOUNTER — Encounter: Payer: Self-pay | Admitting: Nurse Practitioner

## 2024-08-30 MED ORDER — AMLODIPINE BESYLATE 10 MG PO TABS
10.0000 mg | ORAL_TABLET | Freq: Every day | ORAL | 1 refills | Status: AC
  Filled 2024-08-30: qty 90, 90d supply, fill #0
  Filled 2024-12-22: qty 90, 90d supply, fill #1

## 2024-09-10 ENCOUNTER — Ambulatory Visit: Payer: Self-pay | Admitting: Internal Medicine

## 2024-09-10 DIAGNOSIS — N1831 Chronic kidney disease, stage 3a: Secondary | ICD-10-CM

## 2024-09-10 DIAGNOSIS — M1A09X1 Idiopathic chronic gout, multiple sites, with tophus (tophi): Secondary | ICD-10-CM

## 2024-09-10 DIAGNOSIS — Z5181 Encounter for therapeutic drug level monitoring: Secondary | ICD-10-CM

## 2024-09-10 DIAGNOSIS — M1A9XX1 Chronic gout, unspecified, with tophus (tophi): Secondary | ICD-10-CM

## 2024-10-25 ENCOUNTER — Other Ambulatory Visit: Payer: Self-pay

## 2024-10-26 ENCOUNTER — Other Ambulatory Visit: Payer: Self-pay

## 2024-11-05 ENCOUNTER — Telehealth: Payer: Self-pay | Admitting: Nurse Practitioner

## 2024-11-05 NOTE — Telephone Encounter (Signed)
 Contacted pt resch appt!

## 2024-11-26 NOTE — Progress Notes (Deleted)
 Office Visit Note  Patient: Nathan Rubio             Date of Birth: 1971/04/27           MRN: [id number]             PCP: Theotis Haze LELON, NP Referring: Theotis Haze LELON, NP Visit Date: 11/29/2024   Subjective:  No chief complaint on file.   History of Present Illness: Nathan Rubio is a 53 y.o. male here for follow up for chronic, polyarticular, tophaceous, erosive gout  on allopurinol  600 mg daily.   Previous HPI 09/12/2023 Nathan Rubio is a 53 y.o. male here for follow up for chronic, polyarticular, tophaceous, erosive gout  on allopurinol  600 mg daily.  Symptoms are overall doing well without daily stiffness or joint swelling. He as days intermittently with swelling, recalls 2 recent times following drinking iced coffee and after eating a large amount of watermelon.   Previous HPI 09/06/2022 Nathan Rubio is a 53 y.o. male here for follow up for chronic, polyarticular, tophaceous, erosive gout  on allopurinol  600 mg daily. He had no major flare since our last visit. He took aleve for minor pain in the right knee recently but just once dose. He had a decrease in renal function on most recent labs concerning for CKD progression or medication side effect. This was a month after increase in losartan  dose and addition of amlodipine  for incompletely controlled hypertension.   Previous HPI 03/06/2022 Nathan Rubio is a 53 y.o. male here for follow up for chronic, polyarticular, tophaceous, erosive gout on allopurinol  600 mg daily. He is taking allopurinol  600 mg daily reports pretty good adherence. He only took the colchicine  1 or 2 occasions usually has not had much pain. He has been drinking water some of the time but also still drinks a lot of soda. He had a recent vacation in [place] back since this weekend so thinks his diet was more liberal than usual recently.   Previous HPI 09/05/21 Nathan Rubio is a 53 y.o. male here for follow up for chronic, polyarticular,  tophaceous, erosive gouty arthritis on allopurinol  600 mg daily and colchicine  0.6 mg daily as needed.  Overall since last visit doing reasonably well.  He feels there have been a few minor gout flareups but no very severe episodes.  Most recently involved was left knee pain and stiffness that has improved.  He denies any trouble taking the medication.   Previous HPI 02/16/21 Nathan Rubio is a 53 y.o. male with HTN, HLD, CKD stage 3 here for evaluation and management of chronic tophaceous gout.  He has gout flares since at least 25 years ago with varying frequency but somewhere between 3-6 times per year most of the time.  More recently he is also had persistent pain and stiffness even between the specific flares.  He was treated with allopurinol  in the past but did not stay on this medicine consistently due to lack of medical follow-up. He used to take NSAIDs OTC heavily for symptoms but stopped after being found to have CKD. 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 He was previously seen with Dr. Rozella at [facility] rheumatology but not following up due to costs. 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 He has been back on allopurinol  not taking 600 mg daily for few months most recent  uric acid was at goal in November.  He is taking colchicine  as needed for flares which is usually effective within 2 to 3 days.    No Rheumatology ROS completed.   PMFS History:  Patient Active Problem List   Diagnosis Date Noted   CKD (chronic kidney disease) stage 3, GFR 30-59 ml/min (HCC) 03/20/2020   Essential hypertension 03/20/2020   Idiopathic chronic gout of multiple sites with tophus 03/20/2020    Past Medical History:  Diagnosis Date   Gout    Inflammatory polyarthritis (HCC)     Family History  Problem Relation Age of Onset   Hypertension Sister    Depression Neg  Hx    Diabetes Neg Hx    No past surgical history on file. Social History   Social History Narrative   Not on file   Immunization History  Administered Date(s) Administered   PFIZER(Purple Top)SARS-COV-2 Vaccination 04/14/2020, 05/14/2020, 01/05/2021     Objective: Vital Signs: There were no vitals taken for this visit.   Physical Exam   Musculoskeletal Exam: ***  CDAI Exam: CDAI Score: -- Patient Global: --; Provider Global: -- Swollen: --; Tender: -- Joint Exam 11/29/2024   No joint exam has been documented for this visit   There is currently no information documented on the homunculus. Go to the Rheumatology activity and complete the homunculus joint exam.  Investigation: No additional findings.  Imaging: No results found.  Recent Labs: Lab Results  Component Value Date   WBC 7.8 08/10/2024   HGB 16.2 08/10/2024   PLT 268 08/10/2024   NA 141 08/10/2024   K 4.1 08/10/2024   CL 104 08/10/2024   CO2 19 (L) 08/10/2024   GLUCOSE 83 08/10/2024   BUN 27 (H) 08/10/2024   CREATININE 1.78 (H) 08/10/2024   BILITOT 0.3 08/10/2024   ALKPHOS 114 08/10/2024   AST 25 08/10/2024   ALT 18 08/10/2024   PROT 7.2 08/10/2024   ALBUMIN 4.6 08/10/2024   CALCIUM  9.9 08/10/2024   GFRAA 54 (L) 02/16/2021    Speciality Comments: No specialty comments available.  Procedures:  No procedures performed Allergies: Patient has no known allergies.   Assessment / Plan:     Visit Diagnoses: No diagnosis found.  ***  Orders: No orders of the defined types were placed in this encounter.  No orders of the defined types were placed in this encounter.    Follow-Up Instructions: No follow-ups on file.   Renata Gambino M Georgeanna Radziewicz, CMA  Note - This record has been created using Animal nutritionist.  Chart creation errors have been sought, but may not always  have been located. Such creation errors do not reflect on  the standard of medical care.

## 2024-11-29 ENCOUNTER — Ambulatory Visit: Payer: Self-pay | Admitting: Internal Medicine

## 2024-11-29 DIAGNOSIS — M1A09X1 Idiopathic chronic gout, multiple sites, with tophus (tophi): Secondary | ICD-10-CM

## 2024-11-29 DIAGNOSIS — Z5181 Encounter for therapeutic drug level monitoring: Secondary | ICD-10-CM

## 2024-11-29 DIAGNOSIS — N1831 Chronic kidney disease, stage 3a: Secondary | ICD-10-CM

## 2024-12-10 ENCOUNTER — Ambulatory Visit: Payer: Self-pay | Admitting: Nurse Practitioner

## 2024-12-17 ENCOUNTER — Ambulatory Visit: Payer: Self-pay | Admitting: Nurse Practitioner

## 2024-12-22 ENCOUNTER — Other Ambulatory Visit: Payer: Self-pay

## 2024-12-27 ENCOUNTER — Other Ambulatory Visit: Payer: Self-pay
